# Patient Record
Sex: Female | Born: 1970 | Race: White | Hispanic: No | Smoking: Former smoker
Health system: Southern US, Community
[De-identification: ages and names within clinical notes are randomized; demographics above are authoritative.]

## PROBLEM LIST (undated history)

## (undated) DIAGNOSIS — E785 Hyperlipidemia, unspecified: Secondary | ICD-10-CM

## (undated) DIAGNOSIS — J45909 Unspecified asthma, uncomplicated: Secondary | ICD-10-CM

## (undated) DIAGNOSIS — I10 Essential (primary) hypertension: Secondary | ICD-10-CM

## (undated) DIAGNOSIS — I639 Cerebral infarction, unspecified: Secondary | ICD-10-CM

## (undated) HISTORY — DX: Hyperlipidemia, unspecified: E78.5

## (undated) HISTORY — DX: Cerebral infarction, unspecified: I63.9

## (undated) HISTORY — DX: Unspecified asthma, uncomplicated: J45.909

---

## 1998-06-26 ENCOUNTER — Emergency Department (HOSPITAL_COMMUNITY): Admission: EM | Admit: 1998-06-26 | Discharge: 1998-06-26 | Payer: Self-pay | Admitting: Emergency Medicine

## 2001-04-13 ENCOUNTER — Emergency Department (HOSPITAL_COMMUNITY): Admission: EM | Admit: 2001-04-13 | Discharge: 2001-04-13 | Payer: Self-pay | Admitting: *Deleted

## 2001-04-23 ENCOUNTER — Ambulatory Visit (HOSPITAL_COMMUNITY): Admission: RE | Admit: 2001-04-23 | Discharge: 2001-04-23 | Payer: Self-pay | Admitting: Obstetrics & Gynecology

## 2001-04-23 ENCOUNTER — Encounter: Admission: RE | Admit: 2001-04-23 | Discharge: 2001-04-23 | Payer: Self-pay | Admitting: Internal Medicine

## 2001-05-13 ENCOUNTER — Ambulatory Visit (HOSPITAL_COMMUNITY): Admission: RE | Admit: 2001-05-13 | Discharge: 2001-05-13 | Payer: Self-pay | Admitting: Obstetrics & Gynecology

## 2001-06-18 ENCOUNTER — Encounter: Admission: RE | Admit: 2001-06-18 | Discharge: 2001-06-18 | Payer: Self-pay | Admitting: Obstetrics

## 2001-07-15 ENCOUNTER — Encounter: Admission: RE | Admit: 2001-07-15 | Discharge: 2001-07-15 | Payer: Self-pay | Admitting: Obstetrics & Gynecology

## 2001-07-29 ENCOUNTER — Encounter: Admission: RE | Admit: 2001-07-29 | Discharge: 2001-07-29 | Payer: Self-pay | Admitting: Obstetrics & Gynecology

## 2001-08-12 ENCOUNTER — Encounter: Admission: RE | Admit: 2001-08-12 | Discharge: 2001-08-12 | Payer: Self-pay | Admitting: Obstetrics & Gynecology

## 2001-08-14 ENCOUNTER — Ambulatory Visit (HOSPITAL_COMMUNITY): Admission: RE | Admit: 2001-08-14 | Discharge: 2001-08-14 | Payer: Self-pay | Admitting: Obstetrics

## 2001-08-26 ENCOUNTER — Encounter: Payer: Self-pay | Admitting: Obstetrics

## 2001-08-26 ENCOUNTER — Inpatient Hospital Stay (HOSPITAL_COMMUNITY): Admission: AD | Admit: 2001-08-26 | Discharge: 2001-09-02 | Payer: Self-pay | Admitting: *Deleted

## 2001-08-26 ENCOUNTER — Encounter: Admission: RE | Admit: 2001-08-26 | Discharge: 2001-08-26 | Payer: Self-pay | Admitting: Obstetrics & Gynecology

## 2001-09-10 ENCOUNTER — Inpatient Hospital Stay (HOSPITAL_COMMUNITY): Admission: AD | Admit: 2001-09-10 | Discharge: 2001-09-25 | Payer: Self-pay | Admitting: Obstetrics & Gynecology

## 2001-09-10 ENCOUNTER — Encounter: Admission: RE | Admit: 2001-09-10 | Discharge: 2001-09-10 | Payer: Self-pay | Admitting: Obstetrics

## 2001-09-10 ENCOUNTER — Encounter (INDEPENDENT_AMBULATORY_CARE_PROVIDER_SITE_OTHER): Payer: Self-pay

## 2001-09-12 ENCOUNTER — Encounter: Payer: Self-pay | Admitting: *Deleted

## 2001-09-19 ENCOUNTER — Encounter: Payer: Self-pay | Admitting: *Deleted

## 2001-09-28 ENCOUNTER — Inpatient Hospital Stay (HOSPITAL_COMMUNITY): Admission: AD | Admit: 2001-09-28 | Discharge: 2001-09-28 | Payer: Self-pay | Admitting: Obstetrics

## 2001-10-06 ENCOUNTER — Inpatient Hospital Stay (HOSPITAL_COMMUNITY): Admission: AD | Admit: 2001-10-06 | Discharge: 2001-10-06 | Payer: Self-pay | Admitting: *Deleted

## 2001-10-20 ENCOUNTER — Encounter: Admission: RE | Admit: 2001-10-20 | Discharge: 2001-10-20 | Payer: Self-pay | Admitting: Internal Medicine

## 2007-04-10 ENCOUNTER — Emergency Department (HOSPITAL_COMMUNITY): Admission: EM | Admit: 2007-04-10 | Discharge: 2007-04-10 | Payer: Self-pay | Admitting: Emergency Medicine

## 2011-02-08 NOTE — Discharge Summary (Signed)
[facility]  Patient:    Katie Nichols, Katie Nichols Visit Number: [id number] MRN: [id number]          Service Type: MED Location: [facility] Attending Physician:  Tammi Sou Dictated by:   Marlinda Mike, C.N.M. Admit Date:  09/28/2001 Discharge Date: 09/28/2001                             Discharge Summary  ADMITTING DIAGNOSES:          1. Intrauterine pregnancy at [redacted] weeks                                  gestation.                               2. Evaluation for preeclampsia.  DISCHARGE DIAGNOSES:          1. Intrauterine pregnancy at 34-4/7 weeks.                               2. Pregnancy-induced hypertension.                               3. Mild preeclampsia.                               4. Positive group beta Streptococcus.                               5. Asthma, stable condition.                               6. Insulin-requiring gestational diabetic.  HISTORY OF PRESENT ILLNESS:   The patient is a 40 year old gravida 3, para 0-0-2-0, last menstrual period of January 04, 2001, gives an [facility] of October 13, 2001, which correlates with an ultrasound of October 11, 2001.  The patient is 33-1/[redacted] weeks gestation.  Onset of care at the [facility] with transfer to high-risk clinic due to elevated blood pressures in second trimester.  Patient seen at clinic on todays date with noted elevated blood pressures of 164/91, 161/102, 142/82.  A six-pound weight gain in one week with complaint of edema, 2+ edema noted in ankles and feet.  Reflexes within normal limits.  No headache, no visual changes, no epigastric pain.  The patient was sent for direct admission.  Ultrasound on November 22 noted an LGA infant at greater than 97th percentile with an AFI of 16.  The patient had had a one-hour Glucola with a result of 102.  ALLERGIES:                    No known drug allergies.  MEDICATIONS:                  Prenatal vitamins one q.d.,  Tums over-the-counter p.r.n., Advair, one puff b.i.d., albuterol inhaler p.r.n. acute asthma.  PRENATAL LABORATORY DATA:     O positive.  Antibody screen negative.  RPR negative.  Rubella nonimmune.  Hepatitis negative.  HIV negative.  HOSPITAL COURSE:              The patient was admitted for observation, blood pressure monitoring q.4h., and PIH labs.  Lab work revealed on admission white blood cell count of 6.2, hemoglobin of 10.6, hematocrit or 31.1, and platelet count of 160.  Creatinine of 0.5, AST of 10, ALT of 15, LDH of 114, and a uric acid of 4.5.  Labs repeated on day #4 of hospitalization with a hemoglobin of 10.3, hematocrit of 10.9, platelet count of 136, AST of 11, ALT 13, uric acid of 4.4, and LDH of 97.  Of note, patient has low platelet count.  Will continue to monitor.  Patient underwent 24-hour urine.  Results are total volume of 3400 in 24 hours, serum creatinine of 0.6, urine creatinine of 59.3, total protein of 7. Urinalysis was negative, no protein noted on exam.  Group beta strep culture done on admission was positive.  Ultrasound during admission:  The patient had an ultrasound for biophysical profile and AFI. Biophysical profile received six out of eight, two movement, two for tone, two for fluid, 0 for breathing.  AFI was noted to be 15.5, within normal limits. Cervical length of 4.3 translabial at time of exam.  Blood pressures during hospitalization were labile, 130s-160s/70s-90s.  Fasting blood sugars were found to be elevated, and patient was referred for diabetic management during hospitalization, put on an ADA diet, and started on insulin dose.  Patient was started on NPH 10 units subcu at h.s.  Patient to monitor blood sugars at home four times a day.  Patient discharged on September 02, 2001, in stable condition.  Blood pressures labile.  Insulin-requiring gestational diabetes, stable, to be followed at the high-risk clinic and by diabetic management  psot discharge.  PIH with mild pre-eclampsia, blood pressures labile with elevation noted, no higher than greater than 90 diastolic.  Asthma is stable, continue on current management.  Positive group beta strep diagnosed during hospitalization, to treat prophylactically in labor.  Known LGA baby secondary to gestational diabetes.  Will repeat growth three weeks from previous exam. Irregular preterm contractions, no cervical change, no preterm labor.  Patient discharged on September 02, 2001.  DISCHARGE INSTRUCTIONS:       Routine antenatal discharge instructions, to return with increasing contractions, vaginal bleeding, any fluid leakage, or any decreased fetal movement.  Patient is to do fetal kick counts daily. Patient to continue on insulin dose at home and diabetic diet.  Patient is on bed rest in lateral position.  To follow up at the high-risk clinic next Wednesday, as previously scheduled.  PIH precautions also reviewed with the patient, to return with severe headache, upper abdominal pain, nausea, vomiting, visual changes, or increasing swelling. Dictated by:   Marlinda Mike, C.N.M. Attending Physician:  Tammi Sou DD:  10/04/01 TD:  10/05/01 Job: [id number]

## 2011-02-08 NOTE — Op Note (Signed)
[facility]  Patient:    Katie Nichols, Katie Nichols Visit Number: [id number] MRN: [id number]          Service Type: OBS Location: 910B [id number] [id number] Attending Physician:  Antionette Char Dictated by:   Georgina Peer, M.D. Proc. Date: 09/21/01 Admit Date:  09/10/2001   CC:         OB/GYN Teaching Office                           Operative Report  PREOPERATIVE DIAGNOSES:       Intrauterine pregnancy, 36 weeks, insulin-dependent gestational diabetes, severe preeclampsia with oliguria in active phase arrest.  POSTOPERATIVE DIAGNOSIS:      Intrauterine pregnancy, 36 weeks, insulin-dependent gestational diabetes, severe preeclampsia with oliguria in active phase arrest.  OPERATION PERFORMED:          Primary low cervical transverse cesarean section.  SURGEON:                      Georgina Peer, M.D.  ASSISTANT:                    Lucille Passy, M.D.  ANESTHESIA:                   Epidural, Dr. Pamalee Leyden.  ESTIMATED BLOOD LOSS:         750 cc.  FINDINGS:                     An 8 pound 13 ounce female infant at 2339 hours, LOT, normal tubes and ovaries. Apgars were 9 and 9.  INDICATIONS:                  The patient is a 40 year old, gravida 3, para 0-0-2-0 had been hospitalized since December 19 for managment of insulin-dependent gestational diabetes, hypertension and preeclampsia. Induction was performed. The patient reached 8 cm after having spontaneous rupture of membranes on December 29. The patient reached 8 cm with adequate contractions that did not descend below -3 station. There was capita molding in the cervix after an hour and a half, was 6 cm and swollen. Diagnosis of active phase arrest was made. The patients urine output had decreased from approximately 100 cc/h to 20-30 cc/h. It was felt that this preeclampsia was becoming more severe and the patient was not progressing and a cesarean section was offered and accepted. Risks and  complications including bowel, bladder, vascular injury, bleeding, infection of wound or uterus or skin, blood clots, anesthetic complications was all detailed with the patient and her husband and they accepted the risks. An epidural had been placed during labor and she was taken to the operating room. She was prepped and draped in the normal sterile fashion.  DESCRIPTION OF PROCEDURE:     A transverse incision was made after anesthesia was established with an Allis clamp. This was taken into the peritoneal cavity in a normal Pfannenstiel manner. Bleeders were cauterized. A transverse bladder flap was created and the bladder blade placed over the bladder flat where transverse uterine incision was made and at 2339 hours an incision was made and a female infant delivered with the aid of fundal pressure. The infant cried spontaneously. The cord was doubly clamped and cut. The infants mouth and nose were suctioned. Cord blood was taken and the infant weighed 8 pounds and 13 ounces and was assigned Apgars of  9 and 9 by the pediatric team in attendance. The placenta and membranes removed and sent to pathology. There were small bilateral extensions of the uterine incision which were repaired separately with Vicryl suture. Vicryl suture then closed the incision in one layer with a second layer imbricating for bleeding. There was excellent hemostasis. Tubes and ovaries inspected and found to be normal. A figure-of-eight suture reapproximated the bladder flap. The underside of the fascia and muscles were inspected for bleeding and the fascia was closed with absorbable suture. The subcutaneous tissue was closed with plain gut. Bleeders were cauterized and the skin closed with skin staples. Sponge, needle, and instrument counts were correct. The patient did receive Cefotan post cord clamping.  She will be placed back on the magnesium sulfate postoperative, will be monitored closely for blood pressure  and urine output. Labs will be obtained in the morning. She will be placed back on the Glucommander for glucose control until morning at which diet may be advanced. Dictated by:   Georgina Peer, M.D. Attending Physician:  Antionette Char DD:  09/22/01 TD:  09/22/01 Job: [id number]

## 2011-02-08 NOTE — H&P (Signed)
[facility]  Patient:    Katie Nichols, Katie Nichols Visit Number: [id number] MRN: [id number]          Service Type: MED Location: [facility] Attending Physician:  Tammi Sou Dictated by:   Marlinda Mike, CNM Admit Date:  09/28/2001 Discharge Date: 09/28/2001                           History and Physical  ADMISSION DIAGNOSES:          1. Intrauterine pregnancy at [redacted] weeks gestation.                               2. Evaluation for preeclampsia.  HISTORY OF PRESENT ILLNESS:   The patient is a 40 year old gravida 3, para 0-0-2-0, with an EDC of October 13, 2001.  The patient is followed at the high-risk maternity clinic after referral from [facility].  Onset of prenatal care at [redacted] weeks gestation.  Was transferred to the high-risk clinic at approximately [redacted] weeks gestation for elevated blood pressure.  PRENATAL LABORATORY DATA:     A positive, antibody screen negative.  RPR status negative.  Rubella nonimmune.  Hepatitis negative.  HIV negative.  ALLERGIES:                    No known drug allergies.  CURRENT MEDICATIONS:          1. Prenatal vitamins 1 q.d.                               2. Tums over-the-counter p.r.n.                               3. Advair 1 puff b.i.d.                               4. Albuterol inhaler p.r.n. for acute asthma.  PAST MEDICAL HISTORY:         1. History of elevated blood pressures, with                                  referral to high-risk clinic in second                                  trimester.                               2. History of asthma, with a history of multiple                                  admissions for acute asthma attacks.                               3. Two complete spontaneous ABs, with no D&C  required.  PLAN:                         The patient is admitted to the hospital for evaluation of preeclampsia.  Admitting orders include:  Vital signs  routine. Antenatal unit routine, with blood pressures q.4h. while awake.  Bed rest with bathroom privileges.  Fetal monitoring with a non-stress test initially, and then b.i.d. x 1 hour.  The patient is to have an ultrasound for biophysical profile with an AFI.  Regular diet.  She is to have PIH laboratories, which include a CBC, CMET, uric acid, LDH, urinalysis.  To undergo 24-hour urine collection for total protein and creatinine clearance.  A group beta strep culture was also sent.  ADDENDUM:                     ______  the patient was evaluated at the high-risk clinic with a weight gain of six pounds in a week.  No headache, no visual disturbances, no epigastric pain, complaint of edema.  She has 2+ in ankles and feet.  Blood pressures at clinic are 164/91, 167/102, 142/82. After consult with Dr. Tamela Oddi, the patient was sent for a direct admission related to evaluation of preeclampsia. Dictated by:   Marlinda Mike, CNM Attending Physician:  Tammi Sou DD:  10/04/01 TD:  10/04/01 Job: [id number]

## 2011-02-08 NOTE — Discharge Summary (Signed)
[facility]  Patient:    Katie Nichols, Katie Nichols Visit Number: [id number] MRN: [id number]          Service Type: OBS Location: 910A 9132 01 Attending Physician:  Antionette Char Dictated by:   Ocie Doyne, M.D. Admit Date:  09/10/2001 Discharge Date: 09/25/2001   CC:         High [facility] OB Clinic   Discharge Summary  DISCHARGE DIAGNOSES: 1. Term pregnancy delivered. 2. Low transverse cesarean section. 3. Preeclampsia. 4. Gestational diabetes mellitus. 5. Asthma.  DISCHARGE MEDICATIONS: 1. Percocet 5/325 mg one to two p.o. q.6h. p.r.n. pain. 2. Ibuprofen 600 mg p.o. q.6h. p.r.n. pain. 3. Lopressor 25 mg p.o. b.i.d. #60 given with no refills. 4. Prenatal vitamins one p.o. q.d. x6 weeks.  HISTORY OF PRESENT ILLNESS:  This 40 year old G3, P0-0-2-0, was admitted at 35 weeks for severe pregnancy induced hypertension and mild thrombocytopenia. Her pregnancy was complicated by insulin requiring gestational diabetes mellitus and an infant large for gestational age.  For details, please refer to her admission H&P of 09/10/01.  HOSPITAL COURSE:  #1 - PREGNANCY INDUCED HYPERTENSION WITH THROMBOCYTOPENIA:  Ms. Katie Nichols was admitted to the antenatal unit where she was treated with magnesium sulfate and labetalol as needed for elevated blood pressures.  An OB ultrasound revealed a single fetus in cephalic presentation with a biophysical profile which was reassuring.  Induction of labor was attempted using both Cytotec and Cervadil once the cervix had been ripened.  Low dose Pitocin was started.  She spontaneously ruptured her membranes on September 21, 2001.  An IUPC was placed at that time, and penicillin was started because of her positive Group B strep.  Despite an adequate trial of labor with [place] units between 180 and 220 over several hours, her labor arrested in the active phase, and she underwent a low transverse cesarean section on 09/22/01.   There were no complications.  She delivered a female infant with Apgars of 9 and 9 at one and five minutes, respectively.  She was then admitted to the intensive care unit where she received an additional 24 hours of magnesium sulfate.  This was discontinued on September 23, 2001, and she began to diurese and was transferred out of the intensive care unit.  She did well during the rest of her hospital course, although at discharge she continued to have somewhat elevated blood pressures with a systolic of 170 to 180, and a diastolic of 80 to 100.  She was discharged home on Lopressor 25 mg b.i.d.  A home health nurse will reassess her blood pressure on day #1 and day #3 following hospital discharge. She will follow up at the High Risk Clinic or [facility] in six weeks for her postpartum check, and she will return to [facility] on September 28, 2001, to have her staples removed.  At the time of discharge, her incision is clean, dry, and intact.  #2 - GESTATIONAL DIABETES MELLITUS:  This was insulin requiring throughout the pregnancy.  Insulin was discontinued following delivery, and she will not be discharged home on any medication for this condition.  #3 - ASTHMA:  This was adequately controlled during pregnancy on the Advair discuss and an albuterol metered dose inhaler.  She will continue with these medications.  She is being discharged on a beta 1 selective beta blocker, but is instructed that if she has increasing shortness of breath or wheezing that she is to call her physician immediately for further instructions. Dictated by:  Ocie Doyne, M.D. Attending Physician:  Antionette Char DD:  09/25/01 TD:  09/25/01 Job: [id number]

## 2011-07-08 LAB — BASIC METABOLIC PANEL
BUN: 22
CO2: 26
Calcium: 8.9
Chloride: 104
Creatinine, Ser: 0.53
GFR calc Af Amer: >60
GFR calc non Af Amer: >60
Glucose, Bld: 86
Potassium: 3.8
Sodium: 139

## 2011-07-08 LAB — CBC
MCHC: 34.5
Platelets: 177
RBC: 4.56

## 2011-07-08 LAB — DIFFERENTIAL
Basophils Absolute: 0
Basophils Relative: 1
Monocytes Relative: 5
Neutro Abs: 3.6
Neutrophils Relative %: 65

## 2015-06-19 ENCOUNTER — Encounter (HOSPITAL_COMMUNITY): Payer: Self-pay | Admitting: *Deleted

## 2015-06-19 ENCOUNTER — Inpatient Hospital Stay (HOSPITAL_COMMUNITY)
Admission: EM | Admit: 2015-06-19 | Discharge: 2015-06-29 | DRG: 064 | Disposition: A | Discharge status: Skilled Nursing Facility | Payer: Medicaid Other | Attending: Neurology | Admitting: Neurology

## 2015-06-19 ENCOUNTER — Inpatient Hospital Stay (HOSPITAL_COMMUNITY): Payer: Medicaid Other

## 2015-06-19 ENCOUNTER — Emergency Department (HOSPITAL_COMMUNITY): Payer: Medicaid Other

## 2015-06-19 DIAGNOSIS — I619 Nontraumatic intracerebral hemorrhage, unspecified: Secondary | ICD-10-CM | POA: Diagnosis present

## 2015-06-19 DIAGNOSIS — R471 Dysarthria and anarthria: Secondary | ICD-10-CM | POA: Diagnosis present

## 2015-06-19 DIAGNOSIS — I610 Nontraumatic intracerebral hemorrhage in hemisphere, subcortical: Principal | ICD-10-CM

## 2015-06-19 DIAGNOSIS — E2601 Conn's syndrome: Secondary | ICD-10-CM | POA: Diagnosis present

## 2015-06-19 DIAGNOSIS — R402242 Coma scale, best verbal response, confused conversation, at arrival to emergency department: Secondary | ICD-10-CM | POA: Diagnosis present

## 2015-06-19 DIAGNOSIS — G935 Compression of brain: Secondary | ICD-10-CM | POA: Diagnosis present

## 2015-06-19 DIAGNOSIS — G936 Cerebral edema: Secondary | ICD-10-CM | POA: Diagnosis present

## 2015-06-19 DIAGNOSIS — R739 Hyperglycemia, unspecified: Secondary | ICD-10-CM | POA: Diagnosis present

## 2015-06-19 DIAGNOSIS — J9601 Acute respiratory failure with hypoxia: Secondary | ICD-10-CM

## 2015-06-19 DIAGNOSIS — I160 Hypertensive urgency: Secondary | ICD-10-CM | POA: Diagnosis present

## 2015-06-19 DIAGNOSIS — I701 Atherosclerosis of renal artery: Secondary | ICD-10-CM | POA: Diagnosis present

## 2015-06-19 DIAGNOSIS — Z6834 Body mass index (BMI) 34.0-34.9, adult: Secondary | ICD-10-CM

## 2015-06-19 DIAGNOSIS — R402142 Coma scale, eyes open, spontaneous, at arrival to emergency department: Secondary | ICD-10-CM | POA: Diagnosis present

## 2015-06-19 DIAGNOSIS — Z01818 Encounter for other preprocedural examination: Secondary | ICD-10-CM | POA: Insufficient documentation

## 2015-06-19 DIAGNOSIS — R4781 Slurred speech: Secondary | ICD-10-CM | POA: Diagnosis present

## 2015-06-19 DIAGNOSIS — R402362 Coma scale, best motor response, obeys commands, at arrival to emergency department: Secondary | ICD-10-CM | POA: Diagnosis present

## 2015-06-19 DIAGNOSIS — E669 Obesity, unspecified: Secondary | ICD-10-CM | POA: Diagnosis present

## 2015-06-19 DIAGNOSIS — G9340 Encephalopathy, unspecified: Secondary | ICD-10-CM | POA: Diagnosis not present

## 2015-06-19 DIAGNOSIS — Z9911 Dependence on respirator [ventilator] status: Secondary | ICD-10-CM | POA: Insufficient documentation

## 2015-06-19 DIAGNOSIS — G8191 Hemiplegia, unspecified affecting right dominant side: Secondary | ICD-10-CM

## 2015-06-19 DIAGNOSIS — E876 Hypokalemia: Secondary | ICD-10-CM | POA: Diagnosis not present

## 2015-06-19 DIAGNOSIS — Z9289 Personal history of other medical treatment: Secondary | ICD-10-CM

## 2015-06-19 DIAGNOSIS — R400 Somnolence: Secondary | ICD-10-CM

## 2015-06-19 DIAGNOSIS — Z452 Encounter for adjustment and management of vascular access device: Secondary | ICD-10-CM

## 2015-06-19 DIAGNOSIS — E785 Hyperlipidemia, unspecified: Secondary | ICD-10-CM | POA: Diagnosis present

## 2015-06-19 DIAGNOSIS — I615 Nontraumatic intracerebral hemorrhage, intraventricular: Secondary | ICD-10-CM | POA: Diagnosis not present

## 2015-06-19 DIAGNOSIS — I959 Hypotension, unspecified: Secondary | ICD-10-CM | POA: Diagnosis present

## 2015-06-19 DIAGNOSIS — I10 Essential (primary) hypertension: Secondary | ICD-10-CM | POA: Insufficient documentation

## 2015-06-19 DIAGNOSIS — R4701 Aphasia: Secondary | ICD-10-CM | POA: Diagnosis present

## 2015-06-19 DIAGNOSIS — I161 Hypertensive emergency: Secondary | ICD-10-CM | POA: Insufficient documentation

## 2015-06-19 HISTORY — DX: Essential (primary) hypertension: I10

## 2015-06-19 LAB — BLOOD GAS, ARTERIAL
ACID-BASE DEFICIT: 5.8 mmol/L — AB (ref 0.0–2.0)
BICARBONATE: 20.1 meq/L (ref 20.0–24.0)
Drawn by: 437521
FIO2: 1
LHR: 16 {breaths}/min
O2 SAT: 99.5 %
PATIENT TEMPERATURE: 98.6
PCO2 ART: 47.1 mmHg — AB (ref 35.0–45.0)
PEEP: 5 cmH2O
PH ART: 7.253 — AB (ref 7.350–7.450)
TCO2: 21.6 mmol/L (ref 0–100)
VT: 480 mL
pO2, Arterial: 564 mmHg — ABNORMAL HIGH (ref 80.0–100.0)

## 2015-06-19 LAB — URINALYSIS, ROUTINE W REFLEX MICROSCOPIC
Bilirubin Urine: NEGATIVE
GLUCOSE, UA: 250 mg/dL — AB
Ketones, ur: 15 mg/dL — AB
LEUKOCYTES UA: NEGATIVE
Nitrite: NEGATIVE
PH: 7 (ref 5.0–8.0)
Protein, ur: >300 mg/dL — AB
SPECIFIC GRAVITY, URINE: 1.013 (ref 1.005–1.030)
Urobilinogen, UA: 0.2 mg/dL (ref 0.0–1.0)

## 2015-06-19 LAB — DIFFERENTIAL
BASOS PCT: 1 %
Basophils Absolute: 0.1 10*3/uL (ref 0.0–0.1)
EOS PCT: 4 %
Eosinophils Absolute: 0.3 10*3/uL (ref 0.0–0.7)
Lymphocytes Relative: 27 %
Lymphs Abs: 1.9 10*3/uL (ref 0.7–4.0)
MONO ABS: 0.3 10*3/uL (ref 0.1–1.0)
Monocytes Relative: 4 %
NEUTROS ABS: 4.3 10*3/uL (ref 1.7–7.7)
Neutrophils Relative %: 64 %

## 2015-06-19 LAB — CBC
HCT: 43.1 % (ref 36.0–46.0)
Hemoglobin: 14.7 g/dL (ref 12.0–15.0)
MCH: 29.4 pg (ref 26.0–34.0)
MCHC: 34.1 g/dL (ref 30.0–36.0)
MCV: 86.2 fL (ref 78.0–100.0)
PLATELETS: 199 10*3/uL (ref 150–400)
RBC: 5 MIL/uL (ref 3.87–5.11)
RDW: 13.1 % (ref 11.5–15.5)
WBC: 6.8 10*3/uL (ref 4.0–10.5)

## 2015-06-19 LAB — PROTIME-INR
INR: 1.01 (ref 0.00–1.49)
PROTHROMBIN TIME: 13.5 s (ref 11.6–15.2)

## 2015-06-19 LAB — URINE MICROSCOPIC-ADD ON

## 2015-06-19 LAB — COMPREHENSIVE METABOLIC PANEL
ALT: 13 U/L — AB (ref 14–54)
ANION GAP: 11 (ref 5–15)
AST: 19 U/L (ref 15–41)
Albumin: 4.2 g/dL (ref 3.5–5.0)
Alkaline Phosphatase: 56 U/L (ref 38–126)
BUN: 17 mg/dL (ref 6–20)
CHLORIDE: 106 mmol/L (ref 101–111)
CO2: 19 mmol/L — AB (ref 22–32)
Calcium: 9.5 mg/dL (ref 8.9–10.3)
Creatinine, Ser: 0.66 mg/dL (ref 0.44–1.00)
GFR calc non Af Amer: >60 mL/min (ref >60)
Glucose, Bld: 105 mg/dL — ABNORMAL HIGH (ref 65–99)
Potassium: 3.8 mmol/L (ref 3.5–5.1)
SODIUM: 136 mmol/L (ref 135–145)
Total Bilirubin: 0.6 mg/dL (ref 0.3–1.2)
Total Protein: 7.3 g/dL (ref 6.5–8.1)

## 2015-06-19 LAB — I-STAT CHEM 8, ED
BUN: 22 mg/dL — ABNORMAL HIGH (ref 6–20)
Calcium, Ion: 1.02 mmol/L — ABNORMAL LOW (ref 1.12–1.23)
Chloride: 104 mmol/L (ref 101–111)
Creatinine, Ser: 0.6 mg/dL (ref 0.44–1.00)
GLUCOSE: 107 mg/dL — AB (ref 65–99)
HEMATOCRIT: 47 % — AB (ref 36.0–46.0)
HEMOGLOBIN: 16 g/dL — AB (ref 12.0–15.0)
POTASSIUM: 3.7 mmol/L (ref 3.5–5.1)
SODIUM: 138 mmol/L (ref 135–145)
TCO2: 19 mmol/L (ref 0–100)

## 2015-06-19 LAB — CBG MONITORING, ED: GLUCOSE-CAPILLARY: 200 mg/dL — AB (ref 65–99)

## 2015-06-19 LAB — APTT: aPTT: 31 seconds (ref 24–37)

## 2015-06-19 LAB — TRIGLYCERIDES: Triglycerides: 183 mg/dL — ABNORMAL HIGH (ref <150)

## 2015-06-19 LAB — I-STAT TROPONIN, ED: Troponin i, poc: 0 ng/mL (ref 0.00–0.08)

## 2015-06-19 MED ORDER — PROPOFOL 1000 MG/100ML IV EMUL
5.0000 ug/kg/min | INTRAVENOUS | Status: DC
Start: 2015-06-19 — End: 2015-06-19
  Administered 2015-06-19: 50 ug/kg/min via INTRAVENOUS

## 2015-06-19 MED ORDER — ETOMIDATE 2 MG/ML IV SOLN
INTRAVENOUS | Status: AC
  Administered 2015-06-19: 30 mg via INTRAVENOUS
  Filled 2015-06-19: qty 20

## 2015-06-19 MED ORDER — SODIUM CHLORIDE 0.9 % IV SOLN
25.0000 ug/h | INTRAVENOUS | Status: DC
  Administered 2015-06-19: 150 ug/h via INTRAVENOUS
  Administered 2015-06-20 – 2015-06-21 (×3): 300 ug/h via INTRAVENOUS
  Filled 2015-06-19 (×3): qty 50

## 2015-06-19 MED ORDER — LABETALOL HCL 5 MG/ML IV SOLN
10.0000 mg | INTRAVENOUS | Status: DC | PRN
  Administered 2015-06-21: 20 mg via INTRAVENOUS
  Administered 2015-06-21: 10 mg via INTRAVENOUS
  Administered 2015-06-21: 20 mg via INTRAVENOUS
  Administered 2015-06-21: 10 mg via INTRAVENOUS
  Administered 2015-06-21: 40 mg via INTRAVENOUS
  Administered 2015-06-21: 20 mg via INTRAVENOUS
  Administered 2015-06-22 (×4): 40 mg via INTRAVENOUS
  Administered 2015-06-25 (×3): 20 mg via INTRAVENOUS
  Administered 2015-06-25 – 2015-06-26 (×2): 40 mg via INTRAVENOUS
  Administered 2015-06-26: 20 mg via INTRAVENOUS
  Administered 2015-06-26 (×2): 40 mg via INTRAVENOUS
  Filled 2015-06-19 (×2): qty 8
  Filled 2015-06-19: qty 4
  Filled 2015-06-19 (×4): qty 8
  Filled 2015-06-19: qty 4
  Filled 2015-06-19: qty 8
  Filled 2015-06-19: qty 4
  Filled 2015-06-19: qty 8
  Filled 2015-06-19 (×2): qty 4
  Filled 2015-06-19: qty 8
  Filled 2015-06-19: qty 4

## 2015-06-19 MED ORDER — ONDANSETRON HCL 4 MG/2ML IJ SOLN
INTRAMUSCULAR | Status: AC
  Administered 2015-06-19: 4 mg
  Filled 2015-06-19: qty 2

## 2015-06-19 MED ORDER — ALBUTEROL SULFATE (2.5 MG/3ML) 0.083% IN NEBU
2.5000 mg | INHALATION_SOLUTION | RESPIRATORY_TRACT | Status: DC | PRN
  Administered 2015-06-21 – 2015-06-29 (×11): 2.5 mg via RESPIRATORY_TRACT
  Filled 2015-06-19 (×13): qty 3

## 2015-06-19 MED ORDER — FENTANYL CITRATE (PF) 100 MCG/2ML IJ SOLN: 100.0000 ug | INTRAMUSCULAR | Status: DC | PRN

## 2015-06-19 MED ORDER — LIDOCAINE HCL (CARDIAC) 20 MG/ML IV SOLN
INTRAVENOUS | Status: AC
  Filled 2015-06-19: qty 5

## 2015-06-19 MED ORDER — PROPOFOL 1000 MG/100ML IV EMUL
INTRAVENOUS | Status: AC
  Administered 2015-06-19: 50 ug/kg/min via INTRAVENOUS
  Filled 2015-06-19: qty 100

## 2015-06-19 MED ORDER — SENNOSIDES-DOCUSATE SODIUM 8.6-50 MG PO TABS
1.0000 | ORAL_TABLET | Freq: Two times a day (BID) | ORAL | Status: DC
  Administered 2015-06-20 – 2015-06-29 (×15): 1 via ORAL
  Filled 2015-06-19 (×16): qty 1

## 2015-06-19 MED ORDER — ACETAMINOPHEN 650 MG RE SUPP: 650.0000 mg | RECTAL | Status: DC | PRN

## 2015-06-19 MED ORDER — CHLORHEXIDINE GLUCONATE 0.12% ORAL RINSE (MEDLINE KIT)
15.0000 mL | Freq: Two times a day (BID) | OROMUCOSAL | Status: DC
  Administered 2015-06-20 – 2015-06-24 (×11): 15 mL via OROMUCOSAL

## 2015-06-19 MED ORDER — LABETALOL HCL 5 MG/ML IV SOLN
20.0000 mg | Freq: Once | INTRAVENOUS | Status: AC
  Administered 2015-06-19: 20 mg via INTRAVENOUS
  Filled 2015-06-19: qty 4

## 2015-06-19 MED ORDER — ACETAMINOPHEN 325 MG PO TABS
650.0000 mg | ORAL_TABLET | ORAL | Status: DC | PRN
  Administered 2015-06-24 – 2015-06-26 (×2): 650 mg via ORAL
  Filled 2015-06-19 (×2): qty 2

## 2015-06-19 MED ORDER — SODIUM CHLORIDE 0.9 % IV SOLN
10.0000 ug/h | INTRAVENOUS | Status: DC
  Administered 2015-06-19: 25 ug/h via INTRAVENOUS
  Filled 2015-06-19: qty 50

## 2015-06-19 MED ORDER — PANTOPRAZOLE SODIUM 40 MG IV SOLR
40.0000 mg | Freq: Every day | INTRAVENOUS | Status: DC
  Administered 2015-06-19 – 2015-06-22 (×4): 40 mg via INTRAVENOUS
  Filled 2015-06-19 (×4): qty 40

## 2015-06-19 MED ORDER — ANTISEPTIC ORAL RINSE SOLUTION (CORINZ)
7.0000 mL | Freq: Four times a day (QID) | OROMUCOSAL | Status: DC
  Administered 2015-06-20 – 2015-06-25 (×21): 7 mL via OROMUCOSAL

## 2015-06-19 MED ORDER — SODIUM CHLORIDE 0.9 % IV SOLN
1.0000 g | Freq: Once | INTRAVENOUS | Status: AC
  Administered 2015-06-19: 1 g via INTRAVENOUS
  Filled 2015-06-19: qty 10

## 2015-06-19 MED ORDER — NICARDIPINE HCL IN NACL 20-0.86 MG/200ML-% IV SOLN
3.0000 mg/h | INTRAVENOUS | Status: DC
  Administered 2015-06-19: 6.5 mg/h via INTRAVENOUS
  Administered 2015-06-21: 5 mg/h via INTRAVENOUS
  Administered 2015-06-21: 7.5 mg/h via INTRAVENOUS
  Filled 2015-06-19 (×4): qty 200

## 2015-06-19 MED ORDER — ROCURONIUM BROMIDE 50 MG/5ML IV SOLN
INTRAVENOUS | Status: AC
  Filled 2015-06-19: qty 2

## 2015-06-19 MED ORDER — NICARDIPINE HCL IN NACL 20-0.86 MG/200ML-% IV SOLN
3.0000 mg/h | Freq: Once | INTRAVENOUS | Status: AC
  Administered 2015-06-19: 5 mg/h via INTRAVENOUS
  Filled 2015-06-19: qty 200

## 2015-06-19 MED ORDER — PROPOFOL 1000 MG/100ML IV EMUL
0.0000 ug/kg/min | INTRAVENOUS | Status: DC
  Administered 2015-06-19: 50 ug/kg/min via INTRAVENOUS

## 2015-06-19 MED ORDER — SODIUM CHLORIDE 0.9 % IV SOLN: INTRAVENOUS | Status: DC | PRN

## 2015-06-19 MED ORDER — SUCCINYLCHOLINE CHLORIDE 20 MG/ML IJ SOLN
INTRAMUSCULAR | Status: AC
  Administered 2015-06-19: 150 mg via INTRAVENOUS
  Filled 2015-06-19: qty 1

## 2015-06-19 MED ORDER — STROKE: EARLY STAGES OF RECOVERY BOOK
Freq: Once | Status: AC
  Administered 2015-06-19: 21:00:00
  Filled 2015-06-19: qty 1

## 2015-06-19 NOTE — H&P (Addendum)
Stroke Consult    Chief Complaint: right sided weakness  HPI: Katie Nichols is an 44 y.o. female hx of HTN, asthma presenting as code stroke with acute onset of right sided weakness and sensory deficits. LSW 1640 when she initially noted shortness of breath followed by weakness and numbness on her right side. Code stroke activated. On EMS arrival noted to have BP of 280/180 with HR in the 120s. Patient reports history of HTN but has not been to a doctor in years, on no antihypertensives. Not on any antiplatelet or anticoagulant.   CT head imaging reviewed shows left basal ganglia ICH with minimal intra-ventricular extension with around 3mm midline shift. No signs of early hydrocephalus. Initial NIHSS of 12  Date last known well: 9/26 Time last known well: 1630 tPA Given: No, ICH Modified Rankin: Rankin Score=0  ICH Score: 1       No past medical history on file.  No past surgical history on file.  No family history on file. Social History:  has no tobacco, alcohol, and drug history on file.  Allergies: Allergies not on file   (Not in a hospital admission)  ROS: Out of a complete 14 system review, the patient complains of only the following symptoms, and all other reviewed systems are negative. + weakness  Physical Examination: There were no vitals filed for this visit. Physical Exam  Constitutional: He appears well-developed and well-nourished.  Psych: Affect appropriate to situation Eyes: No scleral injection HENT: No OP obstrucion Head: Normocephalic.  Cardiovascular: Normal rate and regular rhythm.  Respiratory: coarse bilateral breath sounds GI: Soft. Bowel sounds are normal. No distension. There is no tenderness.  Skin: WDI  Neurologic Examination: Mental Status: Alert, oriented to name only, intermittently will answer date correctly.  Speech fluent without evidence of aphasia. Mild to moderate dysarthria.  Able to follow simple commands without  difficulty Cranial Nerves: II: funduscopic exam wnl bilaterally, visual fields grossly normal, pupils equal, round, reactive to light and accommodation III,IV, VI: ptosis not present, extra-ocular motions intact bilaterally V,VII: right facial weakness, facial light touch sensation normal bilaterally VIII: hearing normal bilaterally IX,X: gag reflex present XI: trapezius strength/neck flexion strength normal bilaterally XII: tongue strength normal  Motor: LUE and LLE 5/5 strength Flaccid RUE Flaccid RLE Tone and bulk:normal tone throughout; no atrophy noted Sensory: no withdrawal to noxious stimuli on RUE, weak withdrawal on RLE Deep Tendon Reflexes: 2+ and symmetric throughout Plantars: Right: downgoing   Left: downgoing Cerebellar: Unable to test on right side, intact FTN and HTS on left Gait: deferred  Laboratory Studies:   Basic Metabolic Panel:  Recent Labs Lab 06/19/15 1922  NA 138  K 3.7  CL 104  GLUCOSE 107*  BUN 22*  CREATININE 0.60    Liver Function Tests: No results for input(s): AST, ALT, ALKPHOS, BILITOT, PROT, ALBUMIN in the last 168 hours. No results for input(s): LIPASE, AMYLASE in the last 168 hours. No results for input(s): AMMONIA in the last 168 hours.  CBC:  Recent Labs Lab 06/19/15 1922  HGB 16.0*  HCT 47.0*    Cardiac Enzymes: No results for input(s): CKTOTAL, CKMB, CKMBINDEX, TROPONINI in the last 168 hours.  BNP: Invalid input(s): POCBNP  CBG: No results for input(s): GLUCAP in the last 168 hours.  Microbiology: No results found for this or any previous visit.  Coagulation Studies: No results for input(s): LABPROT, INR in the last 72 hours.  Urinalysis: No results for input(s): COLORURINE, LABSPEC, PHURINE, GLUCOSEU, HGBUR, BILIRUBINUR,  KETONESUR, PROTEINUR, UROBILINOGEN, NITRITE, LEUKOCYTESUR in the last 168 hours.  Invalid input(s): APPERANCEUR  Lipid Panel:  No results found for: CHOL, TRIG, HDL, CHOLHDL, VLDL,  LDLCALC  HgbA1C: No results found for: HGBA1C  Urine Drug Screen:  No results found for: LABOPIA, COCAINSCRNUR, LABBENZ, AMPHETMU, THCU, LABBARB  Alcohol Level: No results for input(s): ETH in the last 168 hours.  Other results:  Imaging: No results found.  Assessment: 44 y.o. female hx of HTN admitted as code stroke with acute onset of right sided weakness and sensory deficits. BP upon EMS arrival was 280/180. CT head imaging shows left basal ganglia ICH. Likely hypertensive etiology. Will admit to neuro-ICU, monitor closely. Will plan for repeat CT head in the morning. Stat CT and possible neurosurgical consult if worsening in neurological exam.    Plan: 1) Admit to ICU 2)repeat CT head in the mornign 3) no antiplatelets or anticoagulants 4) blood pressure control with goal systolic <160 5) Frequent neuro checks 6) If symptoms worsen or there is decreased mental status, repeat stat head CT and possible neurosurgical consult 7) PT,OT,ST  Addendum: Patient developed worsening respiratory distress related to likely asthma exacerbation. Decision made to intubate in ED for airway protection. ED to notify CCM.    This patient is critically ill and at significant risk of neurological worsening, death and care requires constant monitoring of vital signs, hemodynamics,respiratory and cardiac monitoring,review of multiple databases, neurological assessment, discussion with family, other specialists and medical decision making of high complexity. I spent 75 inutes of neurocritical care time in the care of this patient.    Elspeth Cho, DO [facility] [phone]  If 7pm- 7am, please page neurology on call as listed in AMION. 06/19/2015, 7:25 PM

## 2015-06-19 NOTE — ED Notes (Signed)
CODE STROKE ACTIVATED -per Aurther Loft ([facility]).

## 2015-06-19 NOTE — ED Provider Notes (Signed)
CSN: [id number]     Arrival date & time 06/19/15  1912 History   First MD Initiated Contact with Patient 06/19/15 1920     Chief Complaint  Patient presents with  . Code Stroke    (Consider location/radiation/quality/duration/timing/severity/associated sxs/prior Treatment) Patient is a 44 y.o. female presenting with Acute Neurological Problem and neurologic complaint. The history is provided by the EMS personnel and the patient. No language interpreter was used.  Cerebrovascular Accident This is a new (Patient not able to appropriately answer all questions with her a fascia and slurred speech.) problem. The current episode started today. The problem occurs constantly. The problem has been gradually worsening. Associated symptoms include numbness and weakness. Pertinent negatives include no chills, congestion, coughing or fever. Associated symptoms comments: Right-sided numbness and weaknes., Slurred speech, confusion.. Nothing aggravates the symptoms. She has tried nothing for the symptoms. The treatment provided no relief.  Neurologic Problem This is a new problem. The current episode started today. The problem occurs constantly. Associated symptoms include numbness and weakness. Pertinent negatives include no chills, congestion, coughing or fever.    Past Medical History  Diagnosis Date  . Hypertension    No past surgical history on file. No family history on file. Social History  Substance Use Topics  . Smoking status: None  . Smokeless tobacco: None  . Alcohol Use: None   OB History    No data available     Review of Systems  Unable to perform ROS Constitutional: Negative for fever and chills.  HENT: Negative for congestion.   Respiratory: Negative for cough.   Neurological: Positive for weakness and numbness.      Allergies  Review of patient's allergies indicates no known allergies.  Home Medications   Prior to Admission medications   Not on File   BP 94/68  mmHg  Pulse 61  Temp(Src) 97.7 F (36.5 C) (Axillary)  Resp 15  Ht  (1.676 m)  Wt 200 lb 6.4 oz (90.9 kg)  BMI 32.36 kg/m2  SpO2 98%  LMP  Physical Exam  Constitutional: She appears well-developed and well-nourished. She appears distressed.  HENT:  Head: Normocephalic and atraumatic.  Mouth/Throat: No oropharyngeal exudate.  Eyes: Conjunctivae are normal. Pupils are equal, round, and reactive to light.  Neck: Normal range of motion.  Cardiovascular: Regular rhythm, normal heart sounds and intact distal pulses.   No murmur heard. Pulmonary/Chest: Effort normal and breath sounds normal. No stridor. She has no wheezes. She exhibits no tenderness.  Abdominal: Soft. There is no rebound and no guarding.  Musculoskeletal: She exhibits no tenderness.  Neurological: She is alert. A sensory deficit is present. No cranial nerve deficit. She exhibits abnormal muscle tone. GCS eye subscore is 4. GCS verbal subscore is 4. GCS motor subscore is 6.  Skin: She is diaphoretic. No pallor.  Nursing note and vitals reviewed.   ED Course  ARTERIAL LINE Date/Time: 06/20/2015 1:23 AM Performed by: Theda Belfast Authorized by: Crista Curb DUO Consent: The procedure was performed in an emergent situation. Required items: required blood products, implants, devices, and special equipment available Patient identity confirmed: verbally with patient and arm band Time out: Immediately prior to procedure a "time out" was called to verify the correct patient, procedure, equipment, support staff and site/side marked as required. Preparation: Patient was prepped and draped in the usual sterile fashion. Indications: hemodynamic monitoring Location: right radial Allen's test normal: yes (Ultrasound guidance utilized) Needle gauge: 20 Seldinger technique: Seldinger technique used Number of attempts:  1 Post-procedure: dressing applied Post-procedure CMS: normal Patient tolerance: Patient tolerated the  procedure well with no immediate complications  INTUBATION Date/Time: 06/20/2015 1:24 AM Performed by: Theda Belfast Authorized by: Crista Curb DUO Consent: The procedure was performed in an emergent situation. Required items: required blood products, implants, devices, and special equipment available Patient identity confirmed: verbally with patient and arm band Time out: Immediately prior to procedure a "time out" was called to verify the correct patient, procedure, equipment, support staff and site/side marked as required. Indications: respiratory distress and  airway protection Intubation method: video-assisted Patient status: paralyzed (RSI) Preoxygenation: nonrebreather mask and BVM Sedatives: etomidate Paralytic: succinylcholine Laryngoscope size: Mac 3 Tube size: 7.5 mm Tube type: cuffed Number of attempts: 1 Cricoid pressure: no Cords visualized: yes Post-procedure assessment: chest rise,  ETCO2 monitor and CO2 detector Breath sounds: equal Cuff inflated: yes ETT to lip: 24 cm Tube secured with: ETT holder Chest x-ray interpreted by me, other physician and radiologist. Chest x-ray findings: endotracheal tube in appropriate position Patient tolerance: Patient tolerated the procedure well with no immediate complications   (including critical care time) Labs Review Labs Reviewed  COMPREHENSIVE METABOLIC PANEL - Abnormal; Notable for the following:    CO2 19 (*)    Glucose, Bld 105 (*)    ALT 13 (*)    All other components within normal limits  BLOOD GAS, ARTERIAL - Abnormal; Notable for the following:    pH, Arterial 7.253 (*)    pCO2 arterial 47.1 (*)    pO2, Arterial 564 (*)    Acid-base deficit 5.8 (*)    All other components within normal limits  URINALYSIS, ROUTINE W REFLEX MICROSCOPIC (NOT AT [facility]) - Abnormal; Notable for the following:    Glucose, UA 250 (*)    Hgb urine dipstick SMALL (*)    Ketones, ur 15 (*)    Protein, ur >300 (*)    All other  components within normal limits  TRIGLYCERIDES - Abnormal; Notable for the following:    Triglycerides 183 (*)    All other components within normal limits  URINE MICROSCOPIC-ADD ON - Abnormal; Notable for the following:    Casts HYALINE CASTS (*)    All other components within normal limits  I-STAT CHEM 8, ED - Abnormal; Notable for the following:    BUN 22 (*)    Glucose, Bld 107 (*)    Calcium, Ion 1.02 (*)    Hemoglobin 16.0 (*)    HCT 47.0 (*)    All other components within normal limits  CBG MONITORING, ED - Abnormal; Notable for the following:    Glucose-Capillary 200 (*)    All other components within normal limits  MRSA PCR SCREENING  PROTIME-INR  APTT  CBC  DIFFERENTIAL  BASIC METABOLIC PANEL  CBC  MAGNESIUM  PHOSPHORUS  BLOOD GAS, ARTERIAL  HEMOGLOBIN A1C  URINE RAPID DRUG SCREEN, HOSP PERFORMED  I-STAT TROPOININ, ED  I-STAT CHEM 8, ED  POC URINE PREG, ED    Imaging Review Ct Head Wo Contrast  06/19/2015   CLINICAL DATA:  RIGHT-sided weakness.  Code stroke.  EXAM: CT HEAD WITHOUT CONTRAST  TECHNIQUE: Contiguous axial images were obtained from the base of the skull through the vertex without intravenous contrast.  COMPARISON:  None.  FINDINGS: Acute hemorrhage is present in the LEFT basal ganglia extending into the LEFT lateral ventricle. This also extends through the foramen of Monro into the anterior RIGHT lateral ventricle and third ventricle. Mass effect is mild. The  hemorrhage measures 27 mm x 24 mm (image 14 series 201).  Tiny amount of blood tracks in of the fourth ventricle. Posterior fossa structures appear normal. 3 mm of LEFT-to-RIGHT midline shift is present.  Calvarium intact. Mastoid air cells clear. Intracranial atherosclerosis.  IMPRESSION: Acute LEFT basal ganglia hemorrhage with intraventricular breakthrough. No early hydrocephalus. 3 mm of LEFT-to- RIGHT midline shift.  Critical Value/emergent results were called by telephone at the time of  interpretation on 06/19/2015 at 7:38 pm to Dr. Hosie Poisson, who verbally acknowledged these results.   Electronically Signed   By: Andreas Newport M.D.   On: 06/19/2015 19:40   Dg Chest Port 1 View  06/19/2015   CLINICAL DATA:  Code stroke.  Intubated patient.  EXAM: PORTABLE CHEST 1 VIEW  COMPARISON:  None.  FINDINGS: Support apparatus: Endotracheal tube tip 26 mm from the carina. Enteric tube present with the tip not visible. Defibrillator pads overlie the chest. Monitoring leads project over the chest.  Cardiomediastinal Silhouette:  Within normal limits.  Lungs: No focal consolidation.  No pneumothorax.  Effusions:  None.  Other:  None.  IMPRESSION: 1. Endotracheal tube tip 26 mm from the carina, in good position. 2. Enteric tube tip not visible. 3. No focal consolidation. Suggestion of pulmonary vascular congestion.   Electronically Signed   By: Andreas Newport M.D.   On: 06/19/2015 21:59   I have personally reviewed and evaluated these images and lab results as part of my medical decision-making.   EKG Interpretation None      MDM   Katie Nichols is a 44 y.o. female with a past medical history significant for untreated hypertension who presents as a code stroke. The patient was last seen normal approximately one hour prior to arrival to the emergency department. The EMS team was called by family when the patient was "acting differently" and could not feel or move her right arm or right leg. The patient was quickly brought to the emergency department. On arrival, the patient was found to have significant weakness and numbness in her right side. The patient also had some slurring of speech but was able to answer questions appropriately.  The patient's airway was deemed intact and the patient was stable for the CT scans. The neurology team was at the bedside as the patient arrived. The patient was found to have a large intracranial hemorrhage likely secondary to significant hypertension. The patient's  blood pressures were above 200 systolic on arrival. The patient was started on a nicardipine drip for blood pressure management. The patient was given 1 dose of IV labetalol prior to nicardipine initiation. The patient had improvement in her blood pressures.  Given the hemodynamic monitoring required, an arterial line was placed by this examiner in the right radial artery. Details of that procedure are seen above. Ultrasound was used for guidance. Next  Following a line placement, the patient began to have worsening mental status and her respiratory rate continued to be elevated. The patient appeared to be fatiguing and the decision was made to intubate the patient for airway protection and impending respiratory failure. The patient was intubated successfully with video laryngoscopy as described above.  Following intubation, the patient was admitted to the critical care service under neurology. The patient did not have any other problems or consultations already in the emergency department. Next  This patient was seen with Dr. Verdie Mosher, emergency medicine attending.   Final diagnoses:  ICH (intracerebral hemorrhage)  Encounter for intubation  Nontraumatic subcortical hemorrhage  of cerebral hemisphere        Theda Belfast, MD 06/20/15 [id number]  Lavera Guise, MD 06/20/15 [phone]

## 2015-06-19 NOTE — ED Notes (Signed)
While patient was in Trauma C her speech became more garbled and her breathing became more labored.  [place] applied but sats remained in the mid 80's on 6l, NRB applied at 10L   Patients breathing became more labored, became blue around the mouth and breathing became "guppy like" breathing.  MD aware and decision was made to intubate the patient.

## 2015-06-19 NOTE — ED Notes (Signed)
Patient presents via EMS.  Patient was found on the floor (original call was for breathing problems) she was not moving her right side and speech was garbled  Upon arrival patient was answering questions but was very anxious

## 2015-06-19 NOTE — Code Documentation (Signed)
Code stroke called at 1859  for this 44 y/o white female pt who was LKW at 1640 hrs.   EMS was originally called to scene for pt with SOB but upon arrival at 1800 hrs pt was found on floor by EMS and noted to have severe right UE and LUE weakness and sensory deficits.    BP per EMS was 280/180 with HR 120s.  Upon arrival to [facility]  at 1912 hrs pt was alert, oriented, following commands, unable to move RUE and RLW but would withdraw to pain.Cleared for CT at 1913 by Dr Rush Landmark, with arrival in CT at 1914 hrs.  Upon completion of CT pt was moved to ED trauma C where her NIHSS scored 11 with points given for partial facial palsy, RUE and RLE motor, partial sensory loss, mild dysarthria and partial neglect.  CT results of a Left Basal Ganglia hemorrhage with IVC breakthrough and a 3 mm left to right midline shift were called to Dr Hosie Poisson at [facility] hrs.  With ED BP 251/165 pt was given labetolol and started on a cardene drip.  CBG 200.  Pt was subsequently intubated for airway protection, and transported to 3 MW02 at 2045 hrs.

## 2015-06-19 NOTE — Consult Note (Signed)
PULMONARY / CRITICAL CARE MEDICINE   Name: Katie Nichols MRN: [id number] DOB: Nov 29, 1970    ADMISSION DATE:  06/19/2015 CONSULTATION DATE:  06/19/15  REFERRING MD :  Roda Shutters  CHIEF COMPLAINT:  weakness  INITIAL PRESENTATION:  44 y.o. F brought to [facility] ED 9/26 as code stroke.  She was found to have left basal ganglia ICH and was intubated for airway protection.  PCCM called for vent management and BP control.    STUDIES:  CT head 9/26 >>> acute left basal ganglia hemorrhage with intraventricular breakthrough.  No early hydrocephalus.  3mm L to R MLS.  SIGNIFICANT EVENTS: 9/26 - admit with left ICH.   HISTORY OF PRESENT ILLNESS:  Pt is encephalopathic; therefore, this HPI is obtained from chart review. Katie Nichols is a 44 y.o. F with PMH of HTN (not on medications) who was brought to [facility] ED 9/26 as code stroke.  She had acute onset of right sided weakness and sensory deficits.  She was last seen normal earlier that evening (around 1640); however, she had SOB at that time for which EMS was dispatched.  On EMS arrival, pt was found on the floor with right sided weakness.  SBP on scene was 280/180.  In ED, CT of the head shows left basal ganglia ICH with minimal intraventricular extension with around 3mm midline shift.  No signs of hydrocephalus.  She was intubated for airway protection and PCCM was consulted for vent management.   PAST MEDICAL HISTORY :   has a past medical history of Hypertension.  has no past surgical history on file. Prior to Admission medications   Not on File   Allergies not on file  FAMILY HISTORY:  No family history on file.  SOCIAL HISTORY:  has no tobacco, alcohol, and drug history on file.  REVIEW OF SYSTEMS:  Unable to obtain as pt is encephalopathic.  SUBJECTIVE:   VITAL SIGNS: Temp:  [97.3 F (36.3 C)] 97.3 F (36.3 C) (09/26 2000) Pulse Rate:  [103] 103 (09/26 2000) Resp:  [30] 30 (09/26 2000) BP: (251)/(165) 251/165 mmHg (09/26 2000) SpO2:  [90  %] 90 % (09/26 2000) Weight:  [90.9 kg (200 lb 6.4 oz)-117.935 kg (260 lb)] 90.9 kg (200 lb 6.4 oz) (09/26 2053) HEMODYNAMICS:   VENTILATOR SETTINGS:   INTAKE / OUTPUT: Intake/Output    None     PHYSICAL EXAMINATION: General: Adult female, in NAD. Neuro: Sedated, does not follow commands. HEENT: [place]/AT. PERRL, sclerae anicteric. Cardiovascular: RRR, no M/R/G.  Lungs: Respirations even and unlabored.  Faint expiratory wheeze. Abdomen: BS x 4, soft, NT/ND.  Musculoskeletal: No gross deformities, no edema.  Skin: Intact, warm, no rashes.  LABS:  CBC  Recent Labs Lab 06/19/15 1918 06/19/15 1922  WBC 6.8  --   HGB 14.7 16.0*  HCT 43.1 47.0*  PLT 199  --    Coag's  Recent Labs Lab 06/19/15 1918  APTT 31  INR 1.01   BMET  Recent Labs Lab 06/19/15 1918 06/19/15 1922  NA 136 138  K 3.8 3.7  CL 106 104  CO2 19*  --   BUN 17 22*  CREATININE 0.66 0.60  GLUCOSE 105* 107*   Electrolytes  Recent Labs Lab 06/19/15 1918  CALCIUM 9.5   Sepsis Markers No results for input(s): LATICACIDVEN, PROCALCITON, O2SATVEN in the last 168 hours. ABG No results for input(s): PHART, PCO2ART, PO2ART in the last 168 hours. Liver Enzymes  Recent Labs Lab 06/19/15 1918  AST 19  ALT  13*  ALKPHOS 56  BILITOT 0.6  ALBUMIN 4.2   Cardiac Enzymes No results for input(s): TROPONINI, PROBNP in the last 168 hours. Glucose  Recent Labs Lab 06/19/15 1953  GLUCAP 200*    Imaging Ct Head Wo Contrast  06/19/2015   CLINICAL DATA:  RIGHT-sided weakness.  Code stroke.  EXAM: CT HEAD WITHOUT CONTRAST  TECHNIQUE: Contiguous axial images were obtained from the base of the skull through the vertex without intravenous contrast.  COMPARISON:  None.  FINDINGS: Acute hemorrhage is present in the LEFT basal ganglia extending into the LEFT lateral ventricle. This also extends through the foramen of Monro into the anterior RIGHT lateral ventricle and third ventricle. Mass effect is mild. The  hemorrhage measures 27 mm x 24 mm (image 14 series 201).  Tiny amount of blood tracks in of the fourth ventricle. Posterior fossa structures appear normal. 3 mm of LEFT-to-RIGHT midline shift is present.  Calvarium intact. Mastoid air cells clear. Intracranial atherosclerosis.  IMPRESSION: Acute LEFT basal ganglia hemorrhage with intraventricular breakthrough. No early hydrocephalus. 3 mm of LEFT-to- RIGHT midline shift.  Critical Value/emergent results were called by telephone at the time of interpretation on 06/19/2015 at 7:38 pm to Dr. Hosie Poisson, who verbally acknowledged these results.   Electronically Signed   By: Andreas Newport M.D.   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 P:   Sedation:  Propofol gtt / Fentanyl PRN. RASS goal: 0 to -1. Daily WUA. Neuro following. Repeat CT head in the AM.  PULMONARY OETT 9/26 >>> A: VDRF due to inability to protect airway in the setting of acute ICH. P:   Full mechanical support, wean as able. VAP prevention measures. SBT in AM if able. Albuterol PRN wheezing. CXR in AM.  CARDIOVASCULAR A:  Hypertensive emergency with acute ICH. P:  Cardene gtt for goal SBP < 160. Labetalol PRN. Will need outpatient follow up as well as outpatient antihypertensive regimen.  RENAL A:   Hypocalcemia. P:   NS @ 75. 1g Ca gluconate. BMP in AM.  GASTROINTESTINAL A:   GI prophylaxis. Nutrition. P:   SUP: Pantoprazole. NPO.  HEMATOLOGIC A:   VTE Prophylaxis. P:  SCD's only. CBC in AM.  INFECTIOUS A:   No indication of infection. P:   Monitor clinically.  ENDOCRINE A:   Hyperglycemia - no hx DM.  P:   SSI. Check Hgb A1c.   Family updated: Husband and sister in law.  Interdisciplinary Family Meeting v Palliative Care Meeting:  Due by: 10/2.  CC time:  35 minutes.  Rutherford Guys, [place] [facility]  Medicine Pager: [phone]  or [phone] 06/19/2015, 9:16 PM  Attending Note:  44 year old with history of HTN presenting with hypertensive emergency and left sided basal ganglia hemorrhagic CVA with associated swelling.  Patient was unable to protect her airway and was intubated.  Lungs are clear on exam.  I reviewed CXR myself and no acute disease with ETT in place.  Will continue labetalol for BP control.  Will place TLC today for 3% saline per neuro recommendations and will f/u.  Maintain on vent for now.  The patient is critically ill with multiple organ systems failure and requires high complexity decision making for assessment and support, frequent evaluation and titration of therapies, application of advanced monitoring technologies and  extensive interpretation of multiple databases.   Critical Care Time devoted to patient care services described in this note is  35  Minutes. This time reflects time of care of this signee Dr Jennet Maduro. This critical care time does not reflect procedure time, or teaching time or supervisory time of PA/NP/Med student/Med Resident etc but could involve care discussion time.  Rush Farmer, M.D. [facility] Pulmonary/Critical Care Medicine. Pager: [phone]. After hours pager: [phone].

## 2015-06-19 NOTE — ED Notes (Signed)
CBG 200 

## 2015-06-20 ENCOUNTER — Inpatient Hospital Stay (HOSPITAL_COMMUNITY): Payer: Medicaid Other

## 2015-06-20 DIAGNOSIS — J9601 Acute respiratory failure with hypoxia: Secondary | ICD-10-CM

## 2015-06-20 DIAGNOSIS — I6789 Other cerebrovascular disease: Secondary | ICD-10-CM

## 2015-06-20 DIAGNOSIS — G936 Cerebral edema: Secondary | ICD-10-CM

## 2015-06-20 LAB — LIPID PANEL
Cholesterol: 176 mg/dL (ref 0–200)
HDL: 53 mg/dL (ref >40)
LDL CALC: 109 mg/dL — AB (ref 0–99)
Total CHOL/HDL Ratio: 3.3 RATIO
Triglycerides: 69 mg/dL (ref <150)
VLDL: 14 mg/dL (ref 0–40)

## 2015-06-20 LAB — BASIC METABOLIC PANEL
Anion gap: 8 (ref 5–15)
BUN: 21 mg/dL — AB (ref 6–20)
CALCIUM: 8.2 mg/dL — AB (ref 8.9–10.3)
CHLORIDE: 105 mmol/L (ref 101–111)
CO2: 23 mmol/L (ref 22–32)
CREATININE: 1.03 mg/dL — AB (ref 0.44–1.00)
GFR calc Af Amer: >60 mL/min (ref >60)
GFR calc non Af Amer: >60 mL/min (ref >60)
GLUCOSE: 74 mg/dL (ref 65–99)
Potassium: 4.1 mmol/L (ref 3.5–5.1)
Sodium: 136 mmol/L (ref 135–145)

## 2015-06-20 LAB — CBC
HCT: 41.1 % (ref 36.0–46.0)
Hemoglobin: 13.7 g/dL (ref 12.0–15.0)
MCH: 29.4 pg (ref 26.0–34.0)
MCHC: 33.3 g/dL (ref 30.0–36.0)
MCV: 88.2 fL (ref 78.0–100.0)
PLATELETS: 190 10*3/uL (ref 150–400)
RBC: 4.66 MIL/uL (ref 3.87–5.11)
RDW: 13.6 % (ref 11.5–15.5)
WBC: 6.6 10*3/uL (ref 4.0–10.5)

## 2015-06-20 LAB — MAGNESIUM: Magnesium: 1.9 mg/dL (ref 1.7–2.4)

## 2015-06-20 LAB — TSH: TSH: 0.641 u[IU]/mL (ref 0.350–4.500)

## 2015-06-20 LAB — MRSA PCR SCREENING: MRSA BY PCR: NEGATIVE

## 2015-06-20 LAB — SODIUM
SODIUM: 139 mmol/L (ref 135–145)
Sodium: 137 mmol/L (ref 135–145)
Sodium: 142 mmol/L (ref 135–145)

## 2015-06-20 LAB — GLUCOSE, CAPILLARY
GLUCOSE-CAPILLARY: 91 mg/dL (ref 65–99)
Glucose-Capillary: 94 mg/dL (ref 65–99)

## 2015-06-20 LAB — POCT PREGNANCY, URINE: Preg Test, Ur: NEGATIVE

## 2015-06-20 LAB — PHOSPHORUS: Phosphorus: 4.6 mg/dL (ref 2.5–4.6)

## 2015-06-20 MED ORDER — ADULT MULTIVITAMIN W/MINERALS CH
1.0000 | ORAL_TABLET | Freq: Every day | ORAL | Status: DC
Start: 2015-06-20 — End: 2015-06-29
  Administered 2015-06-20 – 2015-06-29 (×10): 1
  Filled 2015-06-20 (×10): qty 1

## 2015-06-20 MED ORDER — PRO-STAT SUGAR FREE PO LIQD
30.0000 mL | Freq: Three times a day (TID) | ORAL | Status: DC
  Administered 2015-06-20 – 2015-06-22 (×4): 30 mL
  Filled 2015-06-20 (×4): qty 30

## 2015-06-20 MED ORDER — SODIUM CHLORIDE 3 % IV SOLN
INTRAVENOUS | Status: DC
  Administered 2015-06-20 – 2015-06-23 (×5): 50 mL/h via INTRAVENOUS
  Filled 2015-06-20 (×12): qty 500

## 2015-06-20 MED ORDER — SODIUM CHLORIDE 0.9 % IV BOLUS (SEPSIS)
500.0000 mL | Freq: Once | INTRAVENOUS | Status: AC
  Administered 2015-06-20: 500 mL via INTRAVENOUS

## 2015-06-20 MED ORDER — VITAL HIGH PROTEIN PO LIQD
1000.0000 mL | ORAL | Status: DC
  Administered 2015-06-20: 19:00:00
  Administered 2015-06-20 (×2): 1000 mL
  Filled 2015-06-20 (×6): qty 1000

## 2015-06-20 NOTE — Progress Notes (Signed)
SLP Cancellation Note  Patient Details Name: Katie Nichols MRN: [id number] DOB: Feb 10, 1971   Cancelled treatment:       Reason Eval/Treat Not Completed: Patient not medically ready. Pt intubated.  Riccardo Dubin, Student-SLP  Riccardo Dubin 06/20/2015, 8:29 AM

## 2015-06-20 NOTE — Progress Notes (Signed)
eLink Physician-Brief Progress Note Patient Name: Katie Nichols DOB: 09-Feb-1971 MRN: [id number]   Date of Service  06/20/2015  HPI/Events of Note  QTc interval has gone from 0.48 yesterday >> 0.53 today. Review of medications reveal Propofol and Albuterol as potential causes. However, she has been off Propofol since yesterday and has not received PRN Albuterol today. Other potential causes are hypokalemia, hypomagnesemia and hypocalcemia. K+ = 4.1, Ca++ = 8.2 and Mg++ = 1.9. Therefore, an electrolyte etiology appears unlikely.   eICU Interventions  I see no medications that she is actively receiving that would prolong QTc interval. Continue to monitor Qtc interval.      Intervention Category Intermediate Interventions: Arrhythmia - evaluation and management  Sommer,Steven Eugene 06/20/2015, 3:38 PM

## 2015-06-20 NOTE — Progress Notes (Signed)
STROKE TEAM PROGRESS NOTE  HPI Katie Nichols is a 44 y.o. female hx of HTN, asthma presenting as code stroke with acute onset of right sided weakness and sensory deficits. LSW 1640 when she initially noted shortness of breath followed by weakness and numbness on her right side. Code stroke activated. On EMS arrival noted to have BP of 280/180 with HR in the 120s. Patient reports history of HTN but has not been to a doctor in years, on no antihypertensives. Not on any antiplatelet or anticoagulant.   CT head imaging reviewed shows left basal ganglia ICH with minimal intra-ventricular extension with around 3mm midline shift. No signs of early hydrocephalus. Initial NIHSS of 12  Date last known well: 9/26 Time last known well: 1630 tPA Given: No, ICH Modified Rankin: Rankin Score=0  ICH Score: 1   SUBJECTIVE (INTERVAL HISTORY) Her ex-husband is at the bedside.  Overall she feels her condition is stable. Her repeat CT head this am showed stable left thalamic hematoma but with more prominent midline shift.   OBJECTIVE Temp:  [96.6 F (35.9 C)-98.4 F (36.9 C)] 98.4 F (36.9 C) (09/27 0700) Pulse Rate:  [59-103] 79 (09/27 0727) Cardiac Rhythm:  [-] Normal sinus rhythm (09/26 2100) Resp:  [14-30] 16 (09/27 0727) BP: (79-251)/(51-165) 111/64 mmHg (09/27 0700) SpO2:  [90 %-100 %] 99 % (09/27 0727) Arterial Line BP: (73-138)/(49-71) 138/71 mmHg (09/27 0700) FiO2 (%):  [40 %-100 %] 40 % (09/27 0727) Weight:  [90.9 kg (200 lb 6.4 oz)-117.935 kg (260 lb)] 90.9 kg (200 lb 6.4 oz) (09/26 2053)  CBC:  Recent Labs Lab 06/19/15 1918 06/19/15 1922 06/20/15 0400  WBC 6.8  --  6.6  NEUTROABS 4.3  --   --   HGB 14.7 16.0* 13.7  HCT 43.1 47.0* 41.1  MCV 86.2  --  88.2  PLT 199  --  190    Basic Metabolic Panel:  Recent Labs Lab 06/19/15 1918 06/19/15 1922 06/20/15 0400  NA 136 138 136  K 3.8 3.7 4.1  CL 106 104 105  CO2 19*  --  23  GLUCOSE 105* 107* 74  BUN 17 22* 21*   CREATININE 0.66 0.60 1.03*  CALCIUM 9.5  --  8.2*  MG  --   --  1.9  PHOS  --   --  4.6    Lipid Panel:    Component Value Date/Time   TRIG 183* 06/19/2015 2119   HgbA1c: No results found for: HGBA1C Urine Drug Screen: No results found for: LABOPIA, COCAINSCRNUR, LABBENZ, AMPHETMU, THCU, LABBARB    IMAGING I have personally reviewed the radiological images below and agree with the radiology interpretations. Blue text is my interpretation.  Ct Head Wo Contrast 06/20/2015    1. Unchanged left thalamic hematoma.  2. Newly seen right lateral ventricle and interpeduncular fossa hemorrhage is likely redistribution. Overall, intraventricular hematoma volume is similar to yesterday. However, more prominent midline shift comparing with last CT head. 3. Stable mild dilation of the temporal horns.     Ct Head Wo Contrast 06/19/2015    Acute LEFT basal ganglia hemorrhage with intraventricular breakthrough. No early hydrocephalus. 3 mm of LEFT-to- RIGHT midline shift.     Dg Chest Port 1 View 06/19/2015    1. Endotracheal tube tip 26 mm from the carina, in good position.  2. Enteric tube tip not visible.  3. No focal consolidation. Suggestion of pulmonary vascular congestion.     2D ehco - pending   PHYSICAL EXAM  Temp:  [96.6 F (35.9 C)-98.6 F (37 C)] 98.6 F (37 C) (09/27 0800) Pulse Rate:  [59-103] 71 (09/27 0800) Resp:  [14-30] 16 (09/27 0800) BP: (79-251)/(51-165) 109/68 mmHg (09/27 0800) SpO2:  [90 %-100 %] 97 % (09/27 0800) Arterial Line BP: (73-138)/(49-71) 136/67 mmHg (09/27 0800) FiO2 (%):  [40 %-100 %] 40 % (09/27 0727) Weight:  [200 lb 6.4 oz (90.9 kg)-260 lb (117.935 kg)] 200 lb 6.4 oz (90.9 kg) (09/26 2053)  General - obese, well developed, intubated, drowsy.  Ophthalmologic - Fundi not visualized due to noncooperation.  Cardiovascular - Regular rate and rhythm with no murmur.  Neuro - intubated, drowsy, open eyes on voice, able to follow some simple  commands, such as "open eyes", "show fist" and "thumbs up". Did not show two fingers on command. PERRL, partial palsy of right eye abduction, left eye attends to both sides, not blinking to visual threat on the right, right facial droop, RUE and RLE 0/5 even on pain stimulation, right side decreased sensation with pain stimulation, LUE and LLE spontaneous movement against gravity, right babinski positive, DTR 1+. Not cooperative on coordination test.   ASSESSMENT/PLAN [name] is a 44 y.o. female with history of hypertension and asthma presenting with right-sided weakness and sensory deficits with blood pressure  280/180. She did not receive IV t-PA due to acute left basal ganglia hemorrhage.  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 Also needs to rule out AVM.   Resultant  Right hemiplegia  MRI / MRA not ordered yet - needs to rule out AVM  Repeat CT head showed stable hematoma  Carotid Doppler not ordered yet  UDS - pending  2D Echo pending  LDL pending  HgbA1c pending  VTE prophylaxis SCDs  Diet NPO time specified  no antithrombotic prior to admission, now on no antithrombotic secondary to hemorrhage  Ongoing aggressive stroke risk factor management  Therapy recommendations: Pending  Disposition: Pending  Cerebral edema  Although not large volume ICH, however, medial thalamic ICH causing significant midline shift and uncal herniation  Needs central line and 3% saline  CCM will place central line  Na goal 150-155  Repeat CT in am  Hypertension  BP on the low side however, on admission 280/180  As per ex-husband, pt has HA for the last month, but did not check BP at home  Intravenous Cardene - currently off due to low BP  BP goal < 160 On fentanyl drip  Other Stroke Risk Factors  Obesity, Body mass index is 32.36 kg/(m^2).   Other Active Problems  Mildly elevated BUN and  creatinine  intubated  Hospital day # 1  This patient is critically ill due to left thalamic ICH with ventricular extension needing intubation, uncontrolled BP and at significant risk of neurological worsening, death form enlargement of hematoma, obstructive hydrocephalus, cerebral edma and brain herniation. This patient's care requires constant monitoring of vital signs, hemodynamics, respiratory and cardiac monitoring, review of multiple databases, neurological assessment, discussion with family, other specialists and medical decision making of high complexity. I spent 40 minutes of neurocritical care time in the care of this patient.   Marvel Plan, MD PhD Stroke Neurology 06/20/2015 9:22 AM   To contact Stroke Continuity provider, please refer to [email or web address]. After hours, contact General Neurology

## 2015-06-20 NOTE — Progress Notes (Signed)
Pt transported to and from CT on ventilator. Pt tolerated well

## 2015-06-20 NOTE — Progress Notes (Signed)
Notified CCM of continued hypotension despite d/c propofol.  Orders received to give NS bolus and notify of MAP <70.  Pt is more calm at this time and able to follow commands.  Will continue to monitor.

## 2015-06-20 NOTE — Progress Notes (Signed)
Chaplain was called to accompany family to 251[facility] from the ED waiting room.    Patient's husband was present plus his sister.  I directed them to 3rd floor waiting room, then alerted the RN and MD in 251M where the family was sitting.  Chaplain accompanied PA to talk with family, then sat with family for 20 minutes while they vented/grieved.  Husband lost his father 6 months, post-stroke.  Spiritual care office will follow.  Rev. [place], [place] [phone]

## 2015-06-20 NOTE — Progress Notes (Signed)
eLink Physician-Brief Progress Note Patient Name: Katie Nichols DOB: 1971-04-02 MRN: [id number]   Date of Service  06/20/2015  HPI/Events of Note  Off prop fent 300 Cal[place] But niow BP low side Neuro exam per Rn is improved  eICU Interventions  Avoid any free watre Bolus MAP goals likley to avoi d less than MAP 70 May need line, pressors     Intervention Category Intermediate Interventions: Hypertension - evaluation and management;Hypotension - evaluation and management  FEINSTEIN,DANIEL J. 06/20/2015, 2:06 AM

## 2015-06-20 NOTE — Progress Notes (Signed)
Initial Nutrition Assessment  DOCUMENTATION CODES:   Obesity unspecified  INTERVENTION:   Initiate Vital High Protein @ 40 ml/hr via OG tube.   30 ml Prostat TID.    Tube feeding regimen provides 1260 kcal, 129 grams of protein, and 802 ml of H2O.   NUTRITION DIAGNOSIS:   Inadequate oral intake related to inability to eat as evidenced by NPO status.  GOAL:   Provide needs based on ASPEN/SCCM guidelines  MONITOR:   TF tolerance, Vent status, Labs, I & O's  REASON FOR ASSESSMENT:   Consult Enteral/tube feeding initiation and management  ASSESSMENT:   Pt with hx of untreated HTN admitted after ICH.   Patient is currently intubated on ventilator support MV: 7.8 L/min Temp (24hrs), Avg:97.8 F (36.6 C), Min:96.6 F (35.9 C), Max:98.8 F (37.1 C)  Nutrition-Focused physical exam completed. Findings are no fat depletion, no muscle depletion, and no edema.  Pt discussed during ICU rounds and with RN.    Diet Order:  Diet NPO time specified  Skin:  Reviewed, no issues  Last BM:  unknown  Height:   Ht Readings from Last 1 Encounters:  06/19/15  (1.676 m)   Weight:   Wt Readings from Last 1 Encounters:  06/19/15 200 lb 6.4 oz (90.9 kg)   Ideal Body Weight:  59 kg  BMI:  Body mass index is 32.36 kg/(m^2).  Estimated Nutritional Needs:   Kcal:  5366-4403  Protein:  118  Fluid:  > 1.5 L/day  EDUCATION NEEDS:   No education needs identified at this time  Kendell Bane RD, LDN, CNSC [phone] Pager [phone] After Hours Pager

## 2015-06-20 NOTE — Progress Notes (Signed)
Pt's father arrived from out of town. Dr. Roda Shutters came to bedside to speak with him.

## 2015-06-20 NOTE — Procedures (Signed)
Arterial Catheter Insertion Procedure Note Katie Nichols [id number] 08-02-1971  Procedure: Insertion of Arterial Catheter  Indications: Blood pressure monitoring and Frequent blood sampling  Procedure Details Consent: Unable to obtain consent because of emergent medical necessity. Time Out: Verified patient identification, verified procedure, site/side was marked, verified correct patient position, special equipment/implants available, medications/allergies/relevent history reviewed, required imaging and test results available.  Performed  Maximum sterile technique was used including antiseptics, cap, gloves, gown, hand hygiene, mask and sheet. Skin prep: Chlorhexidine; local anesthetic administered 20 gauge catheter was inserted into left radial artery using the Seldinger technique.  Evaluation Blood flow good; BP tracing good. Complications: No apparent complications.   Koren Bound 06/20/2015

## 2015-06-20 NOTE — Progress Notes (Signed)
Pt not tolerating propofol for sedation with frequent drops in BP despite titration efforts.  Although, with propofol off, pt reaching for ET tube with left hand.  CCM notified and ordered to max out Fentanyl drip and stop propofol.  If pt still reaching for tube to call back.  Will continue to monitor.   Bosie Helper 06/20/2015 12:47 AM

## 2015-06-20 NOTE — Progress Notes (Signed)
OT Cancellation Note  Patient Details Name: Katie Nichols MRN: [id number] DOB: 06-24-1971   Cancelled Treatment:    Reason Eval/Treat Not Completed: Patient not medically ready (Pt intubated with bed rest orders.)  Evern Bio 06/20/2015, 8:18 AM

## 2015-06-20 NOTE — Progress Notes (Addendum)
Approximately 17:00 Fentanyl empty & paused while waiting for new bag. Pt was alert, then vomited large amount bile/clear emesis UTM. Pt neurologically same. gown & linens changed and pt cleaned. Oral & ETT suction. OGT  to suction per nursing judgment, very minimal suctioned in tube. Pt denies any nausea and is resting comfortably. Fentanyl infusing at previous rate. Neuro MD notified of vomiting episode as this was the first one noted today. Per neuro, may continue with plans to hang TF. OGT off suction. Will continue to monitor.

## 2015-06-20 NOTE — Procedures (Signed)
Central Venous Catheter Insertion Procedure Note VERLENA MARLETTE [id number] 04-Jun-1971  Procedure: Insertion of Central Venous Catheter Indications: Drug and/or fluid administration  Procedure Details Consent: Risks of procedure as well as the alternatives and risks of each were explained to the (patient/caregiver).  Consent for procedure obtained. Time Out: Verified patient identification, verified procedure, site/side was marked, verified correct patient position, special equipment/implants available, medications/allergies/relevent history reviewed, required imaging and test results available.  Performed  Maximum sterile technique was used including antiseptics, cap, gloves, gown, hand hygiene, mask and sheet. Skin prep: Chlorhexidine; local anesthetic administered A antimicrobial bonded/coated triple lumen catheter was placed in the right internal jugular vein using the Seldinger technique.  Evaluation Blood flow good Complications: No apparent complications Patient did tolerate procedure well. Chest X-ray ordered to verify placement.  CXR: pending.  U/S used in placement.  YACOUB,WESAM 06/20/2015, 11:28 AM

## 2015-06-20 NOTE — Progress Notes (Signed)
PT Cancellation Note  Patient Details Name: Katie Nichols MRN: [id number] DOB: 1971-07-05   Cancelled Treatment:    Reason Eval/Treat Not Completed: Patient not medically ready.  Intubated and probably proceeding to 3%.  Will see 9/28 as able. 06/20/2015  [place] Bing, PT [phone]  (pager)   Mottinger, Eliseo Gum 06/20/2015, 10:35 AM

## 2015-06-20 NOTE — Progress Notes (Signed)
Utilization Review Completed.Nichols, Katie T9/27/2016  

## 2015-06-20 NOTE — Procedures (Signed)
Arterial Catheter Insertion Procedure Note JOSSETTE ZIRBEL [id number] 12/23/1970  Procedure: Insertion of Arterial Catheter  Indications: Blood pressure monitoring and Frequent blood sampling  Procedure Details Consent: Unable to obtain consent because of altered level of consciousness. Time Out: Verified patient identification, verified procedure, site/side was marked, verified correct patient position, special equipment/implants available, medications/allergies/relevent history reviewed, required imaging and test results available.  Performed  Maximum sterile technique was used including antiseptics, cap, gloves, gown, hand hygiene, mask and sheet. Skin prep: Chlorhexidine; local anesthetic administered 20 gauge catheter was inserted into right radial artery using the Seldinger technique.  Evaluation Blood flow good; BP tracing good. Complications: No apparent complications.   Inez Pilgrim 06/20/2015

## 2015-06-20 NOTE — Progress Notes (Signed)
  Echocardiogram 2D Echocardiogram Without Contrast has been performed.  Katie Nichols 06/20/2015, 9:39 AM

## 2015-06-21 ENCOUNTER — Inpatient Hospital Stay (HOSPITAL_COMMUNITY): Payer: Medicaid Other

## 2015-06-21 ENCOUNTER — Encounter (HOSPITAL_COMMUNITY): Payer: Self-pay | Admitting: Radiology

## 2015-06-21 DIAGNOSIS — E669 Obesity, unspecified: Secondary | ICD-10-CM

## 2015-06-21 DIAGNOSIS — E785 Hyperlipidemia, unspecified: Secondary | ICD-10-CM

## 2015-06-21 LAB — COMPREHENSIVE METABOLIC PANEL
ALBUMIN: 3.5 g/dL (ref 3.5–5.0)
ALK PHOS: 50 U/L (ref 38–126)
ALT: 15 U/L (ref 14–54)
ANION GAP: 7 (ref 5–15)
AST: 16 U/L (ref 15–41)
BUN: 12 mg/dL (ref 6–20)
CO2: 29 mmol/L (ref 22–32)
Calcium: 9 mg/dL (ref 8.9–10.3)
Chloride: 110 mmol/L (ref 101–111)
Creatinine, Ser: 0.44 mg/dL (ref 0.44–1.00)
GFR calc Af Amer: >60 mL/min (ref >60)
GFR calc non Af Amer: >60 mL/min (ref >60)
GLUCOSE: 151 mg/dL — AB (ref 65–99)
POTASSIUM: 2.8 mmol/L — AB (ref 3.5–5.1)
SODIUM: 146 mmol/L — AB (ref 135–145)
Total Bilirubin: 0.4 mg/dL (ref 0.3–1.2)
Total Protein: 7.1 g/dL (ref 6.5–8.1)

## 2015-06-21 LAB — GLUCOSE, CAPILLARY
GLUCOSE-CAPILLARY: 125 mg/dL — AB (ref 65–99)
Glucose-Capillary: 104 mg/dL — ABNORMAL HIGH (ref 65–99)
Glucose-Capillary: 121 mg/dL — ABNORMAL HIGH (ref 65–99)
Glucose-Capillary: 130 mg/dL — ABNORMAL HIGH (ref 65–99)
Glucose-Capillary: 130 mg/dL — ABNORMAL HIGH (ref 65–99)
Glucose-Capillary: 148 mg/dL — ABNORMAL HIGH (ref 65–99)
Glucose-Capillary: 150 mg/dL — ABNORMAL HIGH (ref 65–99)

## 2015-06-21 LAB — SODIUM, URINE, RANDOM: Sodium, Ur: 161 mmol/L

## 2015-06-21 LAB — HEMOGLOBIN A1C
Hgb A1c MFr Bld: 5.6 % (ref 4.8–5.6)
MEAN PLASMA GLUCOSE: 114 mg/dL

## 2015-06-21 LAB — SODIUM
Sodium: 145 mmol/L (ref 135–145)
Sodium: 145 mmol/L (ref 135–145)
Sodium: 145 mmol/L (ref 135–145)

## 2015-06-21 LAB — BASIC METABOLIC PANEL
Anion gap: 6 (ref 5–15)
BUN: 21 mg/dL — AB (ref 6–20)
CALCIUM: 8.3 mg/dL — AB (ref 8.9–10.3)
CHLORIDE: 115 mmol/L — AB (ref 101–111)
CO2: 23 mmol/L (ref 22–32)
CREATININE: 0.54 mg/dL (ref 0.44–1.00)
GFR calc Af Amer: >60 mL/min (ref >60)
GFR calc non Af Amer: >60 mL/min (ref >60)
GLUCOSE: 121 mg/dL — AB (ref 65–99)
Potassium: 3.7 mmol/L (ref 3.5–5.1)
Sodium: 144 mmol/L (ref 135–145)

## 2015-06-21 LAB — CBC
HEMATOCRIT: 36.3 % (ref 36.0–46.0)
HEMOGLOBIN: 11.6 g/dL — AB (ref 12.0–15.0)
MCH: 28.4 pg (ref 26.0–34.0)
MCHC: 32 g/dL (ref 30.0–36.0)
MCV: 89 fL (ref 78.0–100.0)
Platelets: 142 10*3/uL — ABNORMAL LOW (ref 150–400)
RBC: 4.08 MIL/uL (ref 3.87–5.11)
RDW: 13.6 % (ref 11.5–15.5)
WBC: 5.2 10*3/uL (ref 4.0–10.5)

## 2015-06-21 LAB — RAPID URINE DRUG SCREEN, HOSP PERFORMED
AMPHETAMINES: NOT DETECTED
BENZODIAZEPINES: NOT DETECTED
Barbiturates: NOT DETECTED
Cocaine: NOT DETECTED
OPIATES: NOT DETECTED
TETRAHYDROCANNABINOL: POSITIVE — AB

## 2015-06-21 LAB — MAGNESIUM: Magnesium: 2 mg/dL (ref 1.7–2.4)

## 2015-06-21 LAB — OSMOLALITY, URINE: OSMOLALITY UR: 492 mosm/kg (ref 390–1090)

## 2015-06-21 LAB — PHOSPHORUS: PHOSPHORUS: 2.1 mg/dL — AB (ref 2.5–4.6)

## 2015-06-21 MED ORDER — LISINOPRIL 20 MG PO TABS
20.0000 mg | ORAL_TABLET | Freq: Two times a day (BID) | ORAL | Status: DC
  Administered 2015-06-22: 20 mg via ORAL
  Filled 2015-06-21: qty 1

## 2015-06-21 MED ORDER — AMLODIPINE BESYLATE 10 MG PO TABS
10.0000 mg | ORAL_TABLET | Freq: Every day | ORAL | Status: DC
  Administered 2015-06-22 – 2015-06-29 (×8): 10 mg via ORAL
  Filled 2015-06-21 (×8): qty 1

## 2015-06-21 MED ORDER — ATORVASTATIN CALCIUM 10 MG PO TABS
10.0000 mg | ORAL_TABLET | Freq: Every day | ORAL | Status: DC
  Administered 2015-06-22 – 2015-06-28 (×7): 10 mg via ORAL
  Filled 2015-06-21 (×7): qty 1

## 2015-06-21 MED ORDER — CLEVIDIPINE BUTYRATE 0.5 MG/ML IV EMUL
0.0000 mg/h | INTRAVENOUS | Status: DC
  Administered 2015-06-21: 20 mg/h via INTRAVENOUS
  Administered 2015-06-21: 1 mg/h via INTRAVENOUS
  Administered 2015-06-21: 15 mg/h via INTRAVENOUS
  Administered 2015-06-21: 14 mg/h via INTRAVENOUS
  Administered 2015-06-22 (×2): 25 mg/h via INTRAVENOUS
  Filled 2015-06-21: qty 100
  Filled 2015-06-21 (×4): qty 50
  Filled 2015-06-21 (×4): qty 100
  Filled 2015-06-21: qty 50
  Filled 2015-06-21 (×2): qty 100
  Filled 2015-06-21: qty 50

## 2015-06-21 NOTE — Progress Notes (Signed)
PULMONARY / CRITICAL CARE MEDICINE   Name: Katie Nichols MRN: [id number] DOB: 1971/04/08    ADMISSION DATE:  06/19/2015 CONSULTATION DATE:  06/19/15  REFERRING MD :  Roda Shutters  CHIEF COMPLAINT:  weakness  INITIAL PRESENTATION:  44 y.o. F brought to [facility] ED 9/26 as code stroke.  She was found to have left basal ganglia ICH and was intubated for airway protection.  PCCM called for vent management and BP control.    STUDIES:  CT head 9/26 >>> acute left basal ganglia hemorrhage with intraventricular breakthrough.  No early hydrocephalus.  3mm L to R MLS.  SIGNIFICANT EVENTS: 9/26 - admit with left ICH.  HISTORY OF PRESENT ILLNESS:  Pt is encephalopathic; therefore, this HPI is obtained from chart review. Katie Nichols is a 44 y.o. F with PMH of HTN (not on medications) who was brought to [facility] ED 9/26 as code stroke.  She had acute onset of right sided weakness and sensory deficits.  She was last seen normal earlier that evening (around 1640); however, she had SOB at that time for which EMS was dispatched.  On EMS arrival, pt was found on the floor with right sided weakness.  SBP on scene was 280/180.  In ED, CT of the head shows left basal ganglia ICH with minimal intraventricular extension with around 3mm midline shift.  No signs of hydrocephalus.  She was intubated for airway protection and PCCM was consulted for vent management.  SUBJECTIVE: No events overnight, awake and interactive.  VITAL SIGNS: Temp:  [97.3 F (36.3 C)-99.1 F (37.3 C)] 98.4 F (36.9 C) (09/28 0900) Pulse Rate:  [57-103] 81 (09/28 0900) Resp:  [10-21] 16 (09/28 0900) BP: (120-203)/(65-139) 159/83 mmHg (09/28 0900) SpO2:  [88 %-100 %] 97 % (09/28 0900) Arterial Line BP: (123-200)/(69-157) 128/109 mmHg (09/28 0450) FiO2 (%):  [40 %] 40 % (09/28 0715) Weight:  [93.1 kg (205 lb 4 oz)] 93.1 kg (205 lb 4 oz) (09/28 0600) HEMODYNAMICS:   VENTILATOR SETTINGS: Vent Mode:  [-] PRVC FiO2 (%):  [40 %] 40 % Set Rate:  [16  bmp-169 bmp] 16 bmp Vt Set:  [480 mL] 480 mL PEEP:  [5 cmH20] 5 cmH20 Plateau Pressure:  [12 cmH20-18 cmH20] 12 cmH20 INTAKE / OUTPUT: Intake/Output      09/27 0701 - 09/28 0700 09/28 0701 - 09/29 0700   I.V. (mL/kg) 1961.9 (21.1) 495.4 (5.3)   NG/GT 409 80   IV Piggyback     Total Intake(mL/kg) 2370.9 (25.5) 575.4 (6.2)   Urine (mL/kg/hr) 2345 (1) 700 (2.7)   Emesis/NG output 0 (0)    Total Output 2345 700   Net +25.9 -124.6        Emesis Occurrence 1 x     PHYSICAL EXAMINATION: General: Adult female, in NAD. Neuro: Alert and interactive, follows commands on the left only HEENT: [place]/AT. PERRL, sclerae anicteric. Cardiovascular: RRR, no M/R/G.  Lungs: Respirations even and unlabored.  Faint expiratory wheeze. Abdomen: BS x 4, soft, NT/ND.  Musculoskeletal: No gross deformities, no edema.  Skin: Intact, warm, no rashes.  LABS:  CBC  Recent Labs Lab 06/19/15 1918 06/19/15 1922 06/20/15 0400 06/21/15 0320  WBC 6.8  --  6.6 5.2  HGB 14.7 16.0* 13.7 11.6*  HCT 43.1 47.0* 41.1 36.3  PLT 199  --  190 142*   Coag's  Recent Labs Lab 06/19/15 1918  APTT 31  INR 1.01   BMET  Recent Labs Lab 06/19/15 1918 06/19/15 1922 06/20/15 0400  06/20/15  1500 06/20/15 2120 06/21/15 0320  NA 136 138 136  < > 139 142 144  K 3.8 3.7 4.1  --   --   --  3.7  CL 106 104 105  --   --   --  115*  CO2 19*  --  23  --   --   --  23  BUN 17 22* 21*  --   --   --  21*  CREATININE 0.66 0.60 1.03*  --   --   --  0.54  GLUCOSE 105* 107* 74  --   --   --  121*  < > = values in this interval not displayed. Electrolytes  Recent Labs Lab 06/19/15 1918 06/20/15 0400 06/21/15 0320  CALCIUM 9.5 8.2* 8.3*  MG  --  1.9 2.0  PHOS  --  4.6 2.1*   Sepsis Markers No results for input(s): LATICACIDVEN, PROCALCITON, O2SATVEN in the last 168 hours. ABG  Recent Labs Lab 06/19/15 2152  PHART 7.253*  PCO2ART 47.1*  PO2ART 564*   Liver Enzymes  Recent Labs Lab 06/19/15 1918  AST  19  ALT 13*  ALKPHOS 56  BILITOT 0.6  ALBUMIN 4.2   Cardiac Enzymes No results for input(s): TROPONINI, PROBNP in the last 168 hours. Glucose  Recent Labs Lab 06/19/15 1953 06/20/15 1641 06/20/15 2009 06/20/15 2335 06/21/15 0432 06/21/15 0724  GLUCAP 200* 94 91 104* 121* 130*    Imaging Ct Head Wo Contrast  06/21/2015   CLINICAL DATA:  Follow-up examination for intracranial hemorrhage.  EXAM: CT HEAD WITHOUT CONTRAST  TECHNIQUE: Contiguous axial images were obtained from the base of the skull through the vertex without intravenous contrast.  COMPARISON:  Prior study from 06/20/2015.  FINDINGS: Intraparenchymal hematoma centered at the left thalamus measures 2.4 x 2.5 cm, and not significantly changed relative to previous study. Extension into the adjacent white matter tracts again noted. Associated localized vasogenic edema about the hematoma is not significantly changed. Intraventricular extension with blood in the left lateral ventricle is walls the atrium of the right lateral ventricle again seen. Trace hemorrhage at the foramen of Monro. Trace hemorrhage also noted at the cerebral aqueduct. Overall, amount of intraventricular hemorrhage is slightly decreased. Stable dilatation of the temporal horns of the lateral ventricles. Stable 6 mm left-to-right shift at the level of the hematoma.  Previously seen hemorrhage within the inner right ocular cistern is less apparent, consistent with redistribution.  No other new intracranial process. Vascular calcifications again noted within the carotid siphons and distal vertebral arteries.  IMPRESSION: 1. Stable size of left thalamic hematoma with similar localized vasogenic edema. 2. Overall slight interval decrease in intraventricular hemorrhage with stable mild dilatation of the temporal horns. 3. Stable 6 mm left-to-right shift at the level of the hematoma.   Electronically Signed   By: Rise Mu M.D.   On: 06/21/2015 06:11   Dg Chest  Port 1 View  06/21/2015   CLINICAL DATA:  Hypertension.  Ventilator  EXAM: PORTABLE CHEST 1 VIEW  COMPARISON:  06/20/2015  FINDINGS: Support devices are stable. Heart is upper limits normal in size. Lungs are clear. No effusions or acute bony abnormality.  IMPRESSION: Stable support devices.  No acute findings.   Electronically Signed   By: Charlett Nose M.D.   On: 06/21/2015 07:40   Dg Chest Port 1 View  06/20/2015   CLINICAL DATA:  Central line placement.  EXAM: PORTABLE CHEST 1 VIEW  COMPARISON:  Same day.  FINDINGS: The heart size and mediastinal contours are within normal limits. Both lungs are clear. Endotracheal and nasogastric tubes are unchanged in position. No pneumothorax or pleural effusion is noted. Interval placement of right internal jugular catheter with distal tip overlying expected position of SVC. The visualized skeletal structures are unremarkable.  IMPRESSION: Interval placement of right internal jugular catheter with distal tip overlying expected position of the SVC. No pneumothorax is noted. Stable support apparatus.   Electronically Signed   By: Lupita Raider, M.D.   On: 06/20/2015 12:32     ASSESSMENT / PLAN:  NEUROLOGIC A:   Acute left basal ganglia ICH with IVH extension - likely hypertensive in etiology. P:   D/C all sedation. RASS goal: 0 to -1. Daily WUA. Neuro following. Repeat CT head improved.  PULMONARY OETT 9/26 >>> A: VDRF due to inability to protect airway in the setting of acute ICH. P:   SBT for potential extubation today once sedation wears off. VAP prevention measures. Albuterol PRN wheezing. CXR daily while intubated.  CARDIOVASCULAR A:  Hypertensive emergency with acute ICH. P:  Cleviprex gtt for goal SBP < 160. Labetalol PRN. Will need outpatient follow up as well as outpatient antihypertensive regimen. Will start PO if not able to extubate.  RENAL A:   Hypocalcemia. P:   NS @ 50. 1g Ca gluconate. BMP in  AM.  GASTROINTESTINAL A:   GI prophylaxis. Nutrition. P:   SUP: Pantoprazole. TF on hold for potential extubation today if patient wakes up more.  HEMATOLOGIC A:   VTE Prophylaxis. P:  SCD's only. CBC in AM.  INFECTIOUS A:   No indication of infection. P:   Monitor clinically.  ENDOCRINE A:   Hyperglycemia - no hx DM.  P:   SSI. Check Hgb A1c.  Family updated: No family bedside 9/28.  Interdisciplinary Family Meeting v Palliative Care Meeting:  Due by: 10/2.  The patient is critically ill with multiple organ systems failure and requires high complexity decision making for assessment and support, frequent evaluation and titration of therapies, application of advanced monitoring technologies and extensive interpretation of multiple databases.   Critical Care Time devoted to patient care services described in this note is  35  Minutes. This time reflects time of care of this signee Dr Koren Bound. This critical care time does not reflect procedure time, or teaching time or supervisory time of PA/NP/Med student/Med Resident etc but could involve care discussion time.  Alyson Reedy, M.D. [facility] Pulmonary/Critical Care Medicine. Pager: [phone]. After hours pager: [phone].

## 2015-06-21 NOTE — Progress Notes (Signed)
Pt continues to be hypertensive despite multiple doses of PRN labetalol.  Dr. Hosie Poisson notified and ordered to restart Cardene drip.  Will continue to monitor.    Bosie Helper 06/21/2015. 4:39 AM

## 2015-06-21 NOTE — Care Management Note (Signed)
Case Management Note  Patient Details  Name: Katie Nichols MRN: [id number] Date of Birth: Aug 02, 1971  Subjective/Objective:    Pt admitted on 06/19/15 with Lt basal ganglia ICH.  PTA, pt resided at home with family.                  Action/Plan: Will follow for discharge planning as pt progresses.   Pt extubated today; will need PT/OT consults when able to tolerate.    Expected Discharge Date:                  Expected Discharge Plan:  IP Rehab Facility  In-House Referral:     Discharge planning Services  CM Consult  Post Acute Care Choice:    Choice offered to:     DME Arranged:    DME Agency:     HH Arranged:    HH Agency:     Status of Service:  In process, will continue to follow  Medicare Important Message Given:    Date Medicare IM Given:    Medicare IM give by:    Date Additional Medicare IM Given:    Additional Medicare Important Message give by:     If discussed at Long Length of Stay Meetings, dates discussed:    Additional Comments:  Quintella Baton, RN, BSN  Trauma/Neuro ICU Case Manager [phone]

## 2015-06-21 NOTE — Progress Notes (Addendum)
STROKE TEAM PROGRESS NOTE  HPI Katie Nichols is a 44 y.o. female hx of HTN, asthma presenting as code stroke with acute onset of right sided weakness and sensory deficits. LSW 1640 when she initially noted shortness of breath followed by weakness and numbness on her right side. Code stroke activated. On EMS arrival noted to have BP of 280/180 with HR in the 120s. Patient reports history of HTN but has not been to a doctor in years, on no antihypertensives. Not on any antiplatelet or anticoagulant.   CT head imaging reviewed shows left basal ganglia ICH with minimal intra-ventricular extension with around 3mm midline shift. No signs of early hydrocephalus. Initial NIHSS of 12  Date last known well: 9/26 Time last known well: 1630 tPA Given: No, ICH Modified Rankin: Rankin Score=0  ICH Score: 1   SUBJECTIVE (INTERVAL HISTORY) No family is at the bedside.  Overall she feels her condition is stable. Her repeat CT head this am showed stable left thalamic hematoma and midline shift, no hydrocephalus. Pt following commands, still intubated. BP trending up and on cardene drip. Started TF. Na 144.    OBJECTIVE Temp:  [97.3 F (36.3 C)-99.1 F (37.3 C)] 98.2 F (36.8 C) (09/28 1122) Pulse Rate:  [57-103] 84 (09/28 1148) Cardiac Rhythm:  [-] Normal sinus rhythm (09/28 0800) Resp:  [10-21] 18 (09/28 1148) BP: (120-203)/(65-139) 174/85 mmHg (09/28 1148) SpO2:  [88 %-100 %] 97 % (09/28 1148) Arterial Line BP: (128-200)/(69-157) 128/109 mmHg (09/28 0450) FiO2 (%):  [40 %] 40 % (09/28 1148) Weight:  [205 lb 4 oz (93.1 kg)] 205 lb 4 oz (93.1 kg) (09/28 0600)  CBC:  Recent Labs Lab 06/19/15 1918  06/20/15 0400 06/21/15 0320  WBC 6.8  --  6.6 5.2  NEUTROABS 4.3  --   --   --   HGB 14.7  < > 13.7 11.6*  HCT 43.1  < > 41.1 36.3  MCV 86.2  --  88.2 89.0  PLT 199  --  190 142*  < > = values in this interval not displayed.  Basic Metabolic Panel:  Recent Labs Lab 06/20/15 0400   06/21/15 0320 06/21/15 0915  NA 136  < > 144 145  K 4.1  --  3.7  --   CL 105  --  115*  --   CO2 23  --  23  --   GLUCOSE 74  --  121*  --   BUN 21*  --  21*  --   CREATININE 1.03*  --  0.54  --   CALCIUM 8.2*  --  8.3*  --   MG 1.9  --  2.0  --   PHOS 4.6  --  2.1*  --   < > = values in this interval not displayed.  Lipid Panel:     Component Value Date/Time   CHOL 176 06/20/2015 2120   TRIG 69 06/20/2015 2120   HDL 53 06/20/2015 2120   CHOLHDL 3.3 06/20/2015 2120   VLDL 14 06/20/2015 2120   LDLCALC 109* 06/20/2015 2120   HgbA1c:  Lab Results  Component Value Date   HGBA1C 5.6 06/19/2015   Urine Drug Screen: No results found for: LABOPIA, COCAINSCRNUR, LABBENZ, AMPHETMU, THCU, LABBARB    IMAGING I have personally reviewed the radiological images below and agree with the radiology interpretations. Blue text is my interpretation.  Ct Head Wo Contrast  06/21/2015   IMPRESSION: 1. Stable size of left thalamic hematoma with similar localized  vasogenic edema. 2. Overall slight interval decrease in intraventricular hemorrhage with stable mild dilatation of the temporal horns. 3. Stable 6 mm left-to-right shift at the level of the hematoma.   06/20/2015    1. Unchanged left thalamic hematoma.  2. Newly seen right lateral ventricle and interpeduncular fossa hemorrhage is likely redistribution. Overall, intraventricular hematoma volume is similar to yesterday. However, more prominent midline shift comparing with last CT head. 3. Stable mild dilation of the temporal horns.     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 No early hydrocephalus. 3 mm of LEFT-to- RIGHT midline shift.    Dg Chest Port 1 View 06/19/2015    1. Endotracheal tube tip 26 mm from the carina, in good position.  2. Enteric tube tip not visible.  3. No focal consolidation. Suggestion of pulmonary vascular congestion.     2D ehco - - Left ventricle: The cavity  size was normal. Wall thickness was normal. Systolic function was normal. The estimated ejection fraction was in the range of 60% to 65%. Wall motion was normal; there were no regional wall motion abnormalities. Doppler parameters are consistent with abnormal left ventricular relaxation (grade 1 diastolic dysfunction). - Mitral valve: Transvalvular velocity was within the normal range. There was no evidence for stenosis. There was no regurgitation. - Right ventricle: The cavity size was normal. Wall thickness was normal. Systolic function was normal. - Tricuspid valve: There was trivial regurgitation. - Pulmonary arteries: PA peak pressure: 40 mm Hg (S). - Inferior vena cava: The vessel was normal in size. The respirophasic diameter changes were blunted (< 50%), consistent with normal central venous pressure.  Renal artery [place] - pending   PHYSICAL EXAM  Temp:  [97.3 F (36.3 C)-99.1 F (37.3 C)] 98.2 F (36.8 C) (09/28 1122) Pulse Rate:  [57-103] 84 (09/28 1148) Resp:  [10-21] 18 (09/28 1148) BP: (120-203)/(65-139) 174/85 mmHg (09/28 1148) SpO2:  [88 %-100 %] 97 % (09/28 1148) Arterial Line BP: (128-200)/(69-157) 128/109 mmHg (09/28 0450) FiO2 (%):  [40 %] 40 % (09/28 1148) Weight:  [205 lb 4 oz (93.1 kg)] 205 lb 4 oz (93.1 kg) (09/28 0600)  General - obese, well developed, intubated, awake alert.  Ophthalmologic - Fundi not visualized due to noncooperation.  Cardiovascular - Regular rate and rhythm with no murmur.  Neuro - intubated, awake alert, able to follow all simple commands. PERRL, EOMI, blinking to visual threat on the both sides, right facial droop, RUE and RLE 0/5 even on pain stimulation, right side decreased sensation with pain stimulation, LUE and LLE spontaneous movement against gravity, babinski mute b/l, DTR 1+. No ataxia on the LUE.   ASSESSMENT/PLAN Ms. DE LIBMAN is a 45 y.o. female with history of hypertension and asthma presenting  with right-sided weakness and sensory deficits with blood pressure  280/180. She did not receive IV t-PA due to acute left basal ganglia hemorrhage.  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 Also needs to rule out AVM.   Resultant  Right hemiplegia  MRI / MRA not ordered yet - needs to rule out AVM  Repeat CT head showed stable hematoma  Carotid Doppler not ordered yet  UDS - pending  2D Echo unremarkable  LDL 109  HgbA1c 5.6  VTE prophylaxis SCDs Diet NPO time specified  no antithrombotic prior to admission, now on no antithrombotic secondary to hemorrhage  Ongoing aggressive stroke risk factor management  Therapy recommendations: Pending  Disposition: Pending  Cerebral edema  Although not large volume ICH, however, medial thalamic ICH causing significant midline shift and uncal herniation  on 3% saline  Na 136 -> 144  Na goal 150-155  Repeat CT stable  Hypertension, malignant  BP on the high side now  On cardene drip, maxed out  Change to cleviprex for lowering volume intake  BP goal < 160 start po meds Check renal artery ultrasound Check renin, ADH and cathecholamines   Hyperlipidemia   Not on any home meds  LDL 109, goal < 70  Add lipitor    Continue statin on discharge.  Other Stroke Risk Factors  Obesity, Body mass index is 33.14 kg/(m^2).   Other Active Problems  Mildly elevated BUN and creatinine  intubated  Hospital day # 2  This patient is critically ill due to left thalamic ICH with ventricular extension needing intubation, uncontrolled BP and at significant risk of neurological worsening, death form enlargement of hematoma, obstructive hydrocephalus, cerebral edma and brain herniation. This patient's care requires constant monitoring of vital signs, hemodynamics, respiratory and cardiac monitoring, review of multiple databases, neurological assessment, discussion with  family, other specialists and medical decision making of high complexity. I spent 35 minutes of neurocritical care time in the care of this patient.   Marvel Plan, MD PhD Stroke Neurology 06/21/2015 11:54 AM   To contact Stroke Continuity provider, please refer to [email or web address]. After hours, contact General Neurology

## 2015-06-21 NOTE — Progress Notes (Signed)
Pt failed stroke swallow screen-see doc flowsheets. MD notified.

## 2015-06-21 NOTE — Procedures (Signed)
Extubation Procedure Note  Patient Details:   Name: SHREE ESPEY DOB: September 08, 1971 MRN: [id number]   Airway Documentation:  Airway 7.5 mm (Active)  Secured at (cm) 24 cm 06/21/2015 11:48 AM  Measured From Lips 06/21/2015 11:48 AM  Secured Location Center 06/21/2015 11:48 AM  Secured By [employer] 06/21/2015 11:48 AM  Tube Holder Repositioned Yes 06/21/2015 11:48 AM  Cuff Pressure (cm H2O) 26 cm H2O 06/20/2015  7:26 PM  Site Condition Dry 06/21/2015 11:48 AM    Evaluation  O2 sats: stable throughout Complications: No apparent complications Patient did tolerate procedure well. Bilateral Breath Sounds: Clear, Diminished Suctioning: Airway Yes   Positive cuff leak heard prior to extubation. Post extubation pt placed on nasal cannula 4 Lpm with humidity. Sat 97%.  Forest Becker Palmer 06/21/2015, 12:20 PM

## 2015-06-21 NOTE — Progress Notes (Addendum)
Verbal order for extubation per CCM, pt extubated at 1215.  Swallow screen to be completed at apx.1446.

## 2015-06-21 NOTE — Progress Notes (Signed)
Pt becoming progressively more drowsy this afternoon, level of orientation has fluctuated between assessments since extubation, although she is oriented x4 at this time/responsive to voice.  Pupillary response unchanged.  Pt has notable expressive aphasia, but can follow all commands when prompted.  Pt reports double vision, but no visual field loss.  MD notified, STAT head CT and CMET ordered.

## 2015-06-21 NOTE — Progress Notes (Signed)
PT Cancellation Note  Patient Details Name: Katie Nichols MRN: [id number] DOB: 07-25-1971   Cancelled Treatment:    Reason Eval/Treat Not Completed: Patient not medically ready.  Still await activity orders.  On bedrest at this time.  Will see as able 9/29. 06/21/2015  [place] Bing, PT [phone]  (pager)   Mottinger, Eliseo Gum 06/21/2015, 3:14 PM

## 2015-06-21 NOTE — Progress Notes (Signed)
OT Cancellation Note  Patient Details Name: Katie Nichols MRN: [id number] DOB: November 16, 1970   Cancelled Treatment:    Reason Eval/Treat Not Completed: Patient not medically ready Pt currently on bedrest. Please update activity orders when appropriate to initiate therapy. Thanks [facility] Ward, OTR/L  [phone] 06/21/2015 06/21/2015, 8:23 AM

## 2015-06-22 ENCOUNTER — Inpatient Hospital Stay (HOSPITAL_COMMUNITY): Payer: Medicaid Other

## 2015-06-22 ENCOUNTER — Encounter (HOSPITAL_COMMUNITY): Payer: Self-pay | Admitting: Physician Assistant

## 2015-06-22 DIAGNOSIS — E876 Hypokalemia: Secondary | ICD-10-CM

## 2015-06-22 DIAGNOSIS — R1314 Dysphagia, pharyngoesophageal phase: Secondary | ICD-10-CM

## 2015-06-22 DIAGNOSIS — I10 Essential (primary) hypertension: Secondary | ICD-10-CM

## 2015-06-22 DIAGNOSIS — I701 Atherosclerosis of renal artery: Secondary | ICD-10-CM

## 2015-06-22 LAB — CBC
HCT: 43.1 % (ref 36.0–46.0)
HEMOGLOBIN: 14.2 g/dL (ref 12.0–15.0)
MCH: 29.2 pg (ref 26.0–34.0)
MCHC: 32.9 g/dL (ref 30.0–36.0)
MCV: 88.5 fL (ref 78.0–100.0)
PLATELETS: 191 10*3/uL (ref 150–400)
RBC: 4.87 MIL/uL (ref 3.87–5.11)
RDW: 13.4 % (ref 11.5–15.5)
WBC: 9.2 10*3/uL (ref 4.0–10.5)

## 2015-06-22 LAB — BASIC METABOLIC PANEL
ANION GAP: 11 (ref 5–15)
BUN: 14 mg/dL (ref 6–20)
CHLORIDE: 110 mmol/L (ref 101–111)
CO2: 26 mmol/L (ref 22–32)
Calcium: 9.3 mg/dL (ref 8.9–10.3)
Creatinine, Ser: 0.52 mg/dL (ref 0.44–1.00)
GFR calc Af Amer: >60 mL/min (ref >60)
Glucose, Bld: 148 mg/dL — ABNORMAL HIGH (ref 65–99)
POTASSIUM: 2.8 mmol/L — AB (ref 3.5–5.1)
SODIUM: 147 mmol/L — AB (ref 135–145)

## 2015-06-22 LAB — GLUCOSE, CAPILLARY
GLUCOSE-CAPILLARY: 171 mg/dL — AB (ref 65–99)
Glucose-Capillary: 137 mg/dL — ABNORMAL HIGH (ref 65–99)
Glucose-Capillary: 153 mg/dL — ABNORMAL HIGH (ref 65–99)
Glucose-Capillary: 133 mg/dL — ABNORMAL HIGH (ref 65–99)

## 2015-06-22 LAB — SODIUM, URINE, RANDOM: SODIUM UR: 106 mmol/L

## 2015-06-22 LAB — PHOSPHORUS: Phosphorus: <1 mg/dL — CL (ref 2.5–4.6)

## 2015-06-22 LAB — OSMOLALITY, URINE: Osmolality, Ur: 554 mOsm/kg (ref 390–1090)

## 2015-06-22 LAB — SODIUM
SODIUM: 148 mmol/L — AB (ref 135–145)
SODIUM: 141 mmol/L (ref 135–145)
SODIUM: 148 mmol/L — AB (ref 135–145)

## 2015-06-22 LAB — MAGNESIUM: MAGNESIUM: 2.1 mg/dL (ref 1.7–2.4)

## 2015-06-22 MED ORDER — POTASSIUM CHLORIDE 20 MEQ PO PACK
40.0000 meq | PACK | Freq: Two times a day (BID) | ORAL | Status: DC
  Filled 2015-06-22: qty 2

## 2015-06-22 MED ORDER — HYDRALAZINE HCL 50 MG PO TABS
50.0000 mg | ORAL_TABLET | Freq: Three times a day (TID) | ORAL | Status: DC
  Administered 2015-06-22 – 2015-06-23 (×3): 50 mg via ORAL
  Filled 2015-06-22 (×3): qty 1

## 2015-06-22 MED ORDER — SODIUM CHLORIDE 1 G PO TABS
2.0000 g | ORAL_TABLET | Freq: Three times a day (TID) | ORAL | Status: DC
  Administered 2015-06-22 – 2015-06-23 (×2): 2 g via ORAL
  Filled 2015-06-22 (×5): qty 2

## 2015-06-22 MED ORDER — ESMOLOL HCL-SODIUM CHLORIDE 2000 MG/100ML IV SOLN
25.0000 ug/kg/min | INTRAVENOUS | Status: DC
  Administered 2015-06-22: 25 ug/kg/min via INTRAVENOUS
  Administered 2015-06-22 (×2): 300 ug/kg/min via INTRAVENOUS
  Administered 2015-06-22: 200 ug/kg/min via INTRAVENOUS
  Administered 2015-06-22: 75 ug/kg/min via INTRAVENOUS
  Administered 2015-06-23: 25 ug/kg/min via INTRAVENOUS
  Filled 2015-06-22 (×8): qty 100

## 2015-06-22 MED ORDER — POTASSIUM CHLORIDE 20 MEQ/15ML (10%) PO SOLN
40.0000 meq | Freq: Two times a day (BID) | ORAL | Status: DC
  Filled 2015-06-22: qty 30

## 2015-06-22 MED ORDER — POTASSIUM PHOSPHATES 15 MMOLE/5ML IV SOLN
30.0000 mmol | Freq: Once | INTRAVENOUS | Status: AC
  Administered 2015-06-22: 30 mmol via INTRAVENOUS
  Filled 2015-06-22: qty 10

## 2015-06-22 MED ORDER — POTASSIUM CHLORIDE 10 MEQ/50ML IV SOLN
10.0000 meq | INTRAVENOUS | Status: AC
  Administered 2015-06-22 (×2): 10 meq via INTRAVENOUS
  Filled 2015-06-22 (×2): qty 50

## 2015-06-22 MED ORDER — METOPROLOL TARTRATE 25 MG PO TABS
25.0000 mg | ORAL_TABLET | Freq: Two times a day (BID) | ORAL | Status: DC
  Administered 2015-06-22: 25 mg via ORAL
  Filled 2015-06-22: qty 1

## 2015-06-22 MED ORDER — CLEVIDIPINE BUTYRATE 0.5 MG/ML IV EMUL
0.0000 mg/h | INTRAVENOUS | Status: DC
  Administered 2015-06-22 (×3): 25 mg/h via INTRAVENOUS
  Administered 2015-06-22: 13 mg/h via INTRAVENOUS
  Administered 2015-06-22: 1 mg/h via INTRAVENOUS
  Administered 2015-06-23 – 2015-06-24 (×10): 25 mg/h via INTRAVENOUS
  Administered 2015-06-24: 10 mg/h via INTRAVENOUS
  Administered 2015-06-24: 25 mg/h via INTRAVENOUS
  Administered 2015-06-24: 15 mg/h via INTRAVENOUS
  Administered 2015-06-24: 10 mg/h via INTRAVENOUS
  Administered 2015-06-24 (×2): 25 mg/h via INTRAVENOUS
  Administered 2015-06-25: 5 mg/h via INTRAVENOUS
  Filled 2015-06-22 (×6): qty 100
  Filled 2015-06-22: qty 50
  Filled 2015-06-22 (×20): qty 100

## 2015-06-22 MED ORDER — POTASSIUM PHOSPHATES 15 MMOLE/5ML IV SOLN
40.0000 meq | Freq: Once | INTRAVENOUS | Status: AC
  Administered 2015-06-22: 40 meq via INTRAVENOUS
  Filled 2015-06-22 (×2): qty 9.09

## 2015-06-22 NOTE — Progress Notes (Addendum)
Preliminary results by tech - Renal Duplex Completed. Elevated velocities in the right renal artery are consistent with 60-99% stenosis. Left renal artery appeared within normal limits. Results given to patient's nurse.  Marilynne Halsted, BS, RDMS, RVT

## 2015-06-22 NOTE — Evaluation (Signed)
Speech Language Pathology Evaluation Patient Details Name: Katie Nichols MRN: [id number] DOB: 12-13-1970 Today's Date: 06/22/2015 Time: 4098-1191 SLP Time Calculation (min) (ACUTE ONLY): 19 min  Problem List:  Patient Active Problem List   Diagnosis Date Noted  . Acute respiratory failure with hypoxemia 06/20/2015  . ICH (intracerebral hemorrhage) 06/19/2015  . Encounter for intubation   . Hypertensive emergency   . Ventilator dependent   . Hypocalcemia    Past Medical History:  Past Medical History  Diagnosis Date  . Hypertension    Past Surgical History: No past surgical history on file. HPI:  Katie Nichols is a 44 y.o. female hx of HTN, asthma presenting with SOB and code stroke with acute onset of right sided weakness and sensory deficits. CT head imaging reviewed shows left basal ganglia ICH with minimal intra-ventricular extension with around 3mm midline shift. Intubated 9/26-9/28.    Assessment / Plan / Recommendation Clinical Impression  Cognitive-linguistic evaluation complete. Patient presents with a mild aphasia with both expressive and receptive components. Note patient with c/o blurred/double vision but did not impact reading function (large print) during today's exam. Patient will benefit from acute SLP f/u as well as SLP f/u after d/c to maximize communication abilities.     SLP Assessment  Patient needs continued Speech Lanaguage Pathology Services    Follow Up Recommendations  Inpatient Rehab    Frequency and Duration min 2x/week  2 weeks   Pertinent Vitals/Pain Pain Assessment: No/denies pain   SLP Goals  Potential to Achieve Goals (ACUTE ONLY): Good  SLP Evaluation Prior Functioning  Cognitive/Linguistic Baseline: Within functional limits   Cognition  Overall Cognitive Status: Within Functional Limits for tasks assessed (for basic tasks assessed) Arousal/Alertness: Lethargic Orientation Level: Oriented to person;Oriented to place    Comprehension  Auditory Comprehension Overall Auditory Comprehension: Impaired Yes/No Questions: Impaired Complex Questions: 75-100% accurate Commands: Impaired Multistep Basic Commands: 0-24% accurate Complex Commands: 0-24% accurate Interfering Components: Other (comment) (lethargy) Visual Recognition/Discrimination Discrimination: Within Function Limits Reading Comprehension Reading Status: Within funtional limits    Expression Expression Primary Mode of Expression: Verbal Verbal Expression Overall Verbal Expression: Impaired Initiation: No impairment Automatic Speech: Name;Social Response Level of Generative/Spontaneous Verbalization: Sentence Repetition: No impairment Naming: Impairment Responsive: 76-100% accurate Confrontation: Within functional limits Verbal Errors: Semantic paraphasias;Not aware of errors Pragmatics: Impairment Impairments: Eye contact;Abnormal affect;Monotone;Other (comment) (flat affect) Written Expression Dominant Hand: Right   Oral / Motor Oral Motor/Sensory Function Overall Oral Motor/Sensory Function: Impaired Labial ROM: Reduced right Labial Symmetry: Abnormal symmetry right Labial Strength: Reduced Labial Sensation: Reduced Lingual ROM: Within Functional Limits Lingual Symmetry: Abnormal symmetry right Lingual Strength: Reduced Lingual Sensation: Within Functional Limits Facial ROM: Reduced right Facial Symmetry: Right droop Facial Strength: Reduced Facial Sensation: Reduced Velum: Within Functional Limits Mandible: Within Functional Limits Motor Speech Overall Motor Speech: Appears within functional limits for tasks assessed   GO    Katie Nichols, CCC-SLP [phone]  Katie Lango Meryl 06/22/2015, 10:56 AM

## 2015-06-22 NOTE — Progress Notes (Signed)
STROKE TEAM PROGRESS NOTE   SUBJECTIVE (INTERVAL HISTORY) No family is at the bedside. She was extubated yesterday and tolerating well, but did not pass swallow. No po meds so far. Continued on cleviprex but maxed out. Changed to esmolol overnight maxed out but BP still high > 180s. Resumed cleviprex on top of esmolol this am.    OBJECTIVE Temp:  [97.7 F (36.5 C)-100 F (37.8 C)] 98.8 F (37.1 C) (09/29 0945) Pulse Rate:  [75-113] 85 (09/29 0945) Cardiac Rhythm:  [-] Normal sinus rhythm (09/29 0800) Resp:  [10-23] 18 (09/29 0945) BP: (155-205)/(77-125) 168/99 mmHg (09/29 0945) SpO2:  [90 %-100 %] 96 % (09/29 0945) FiO2 (%):  [40 %] 40 % (09/28 1200) Weight:  [194 lb 10.7 oz (88.3 kg)] 194 lb 10.7 oz (88.3 kg) (09/29 0500)  CBC:  Recent Labs Lab 06/19/15 1918  06/21/15 0320 06/22/15 0235  WBC 6.8  < > 5.2 9.2  NEUTROABS 4.3  --   --   --   HGB 14.7  < > 11.6* 14.2  HCT 43.1  < > 36.3 43.1  MCV 86.2  < > 89.0 88.5  PLT 199  < > 142* 191  < > = values in this interval not displayed.  Basic Metabolic Panel:  Recent Labs Lab 06/21/15 0320  06/21/15 1604  06/22/15 0235 06/22/15 0828  NA 144  < > 146*  < > 147* 148*  K 3.7  --  2.8*  --  2.8*  --   CL 115*  --  110  --  110  --   CO2 23  --  29  --  26  --   GLUCOSE 121*  --  151*  --  148*  --   BUN 21*  --  12  --  14  --   CREATININE 0.54  --  0.44  --  0.52  --   CALCIUM 8.3*  --  9.0  --  9.3  --   MG 2.0  --   --   --  2.1  --   PHOS 2.1*  --   --   --  <1.0*  --   < > = values in this interval not displayed.  Lipid Panel:     Component Value Date/Time   CHOL 176 06/20/2015 2120   TRIG 69 06/20/2015 2120   HDL 53 06/20/2015 2120   CHOLHDL 3.3 06/20/2015 2120   VLDL 14 06/20/2015 2120   LDLCALC 109* 06/20/2015 2120   HgbA1c:  Lab Results  Component Value Date   HGBA1C 5.6 06/19/2015   Urine Drug Screen:     Component Value Date/Time   LABOPIA NONE DETECTED 06/21/2015 1233   COCAINSCRNUR NONE  DETECTED 06/21/2015 1233   LABBENZ NONE DETECTED 06/21/2015 1233   AMPHETMU NONE DETECTED 06/21/2015 1233   THCU POSITIVE* 06/21/2015 1233   LABBARB NONE DETECTED 06/21/2015 1233      IMAGING I have personally reviewed the radiological images below and agree with the radiology interpretations. Blue text is my interpretation.  Ct Head Wo Contrast  06/21/2015   IMPRESSION: 1. Stable size of left thalamic hematoma with similar localized vasogenic edema. 2. Overall slight interval decrease in intraventricular hemorrhage with stable mild dilatation of the temporal horns. 3. Stable 6 mm left-to-right shift at the level of the hematoma.   06/20/2015    1. Unchanged left thalamic hematoma.  2. Newly seen right lateral ventricle and interpeduncular fossa hemorrhage is likely redistribution. Overall,  intraventricular hematoma volume is similar to yesterday. However, more prominent midline shift comparing with last CT head. 3. Stable mild dilation of the temporal horns.     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 No early hydrocephalus. 3 mm of LEFT-to- RIGHT midline shift.    Dg Chest Port 1 View 06/19/2015    1. Endotracheal tube tip 26 mm from the carina, in good position.  2. Enteric tube tip not visible.  3. No focal consolidation. Suggestion of pulmonary vascular congestion.     2D ehco - - Left ventricle: The cavity size was normal. Wall thickness was normal. Systolic function was normal. The estimated ejection fraction was in the range of 60% to 65%. Wall motion was normal; there were no regional wall motion abnormalities. Doppler parameters are consistent with abnormal left ventricular relaxation (grade 1 diastolic dysfunction). - Mitral valve: Transvalvular velocity was within the normal range. There was no evidence for stenosis. There was no regurgitation. - Right ventricle: The cavity size was normal. Wall thickness  was normal. Systolic function was normal. - Tricuspid valve: There was trivial regurgitation. - Pulmonary arteries: PA peak pressure: 40 mm Hg (S). - Inferior vena cava: The vessel was normal in size. The respirophasic diameter changes were blunted (< 50%), consistent with normal central venous pressure.  Renal artery [place] - pending   PHYSICAL EXAM  Temp:  [97.7 F (36.5 C)-100 F (37.8 C)] 98.8 F (37.1 C) (09/29 0945) Pulse Rate:  [75-113] 85 (09/29 0945) Resp:  [10-23] 18 (09/29 0945) BP: (155-205)/(77-125) 168/99 mmHg (09/29 0945) SpO2:  [90 %-100 %] 96 % (09/29 0945) FiO2 (%):  [40 %] 40 % (09/28 1200) Weight:  [194 lb 10.7 oz (88.3 kg)] 194 lb 10.7 oz (88.3 kg) (09/29 0500)  General - obese, well developed, awake alert.  Ophthalmologic - Fundi not visualized due to noncooperation.  Cardiovascular - Regular rate and rhythm with no murmur.  Neuro - intubated, awake alert, able to follow all simple commands, with transcortical motor aphasia. PERRL, EOMI, blinking to visual threat on the both sides, right facial droop, RUE and RLE 0/5 even on pain stimulation, right side decreased sensation with pain stimulation, LUE and LLE spontaneous movement against gravity, babinski mute b/l, DTR 1+. No ataxia on the LUE.   ASSESSMENT/[name] is a 44 y.o. female with history of hypertension and asthma presenting with right-sided weakness and sensory deficits with blood pressure  280/180. She did not receive IV t-PA due to acute left basal ganglia hemorrhage.   ICH with ventricular extension:  acute left thalamic hemorrhage likely secondary to hypertension although needs to rule out AVM.   Resultant  Right hemiplegia, transcortical aphasia.  MRI / MRA not ordered yet - needs to rule out AVM  Repeat CT head showed stable hematoma  Carotid Doppler not ordered yet  UDS - positive for THC  2D Echo unremarkable  LDL 109  HgbA1c 5.6  VTE prophylaxis SCDs Diet  NPO time specified  no antithrombotic prior to admission, now on no antithrombotic secondary to hemorrhage  Ongoing aggressive stroke risk factor management  Therapy recommendations: Pending  Disposition: Pending  Cerebral edema  Although not large volume ICH, however, medial thalamic ICH causing significant midline shift and uncal herniation  on 3% saline  Na 136 -> 144 -> 147 -> 148  Na goal 150-155  Repeat CT stable  Hypertension, malignant  BP on the high side now  On cleviprex  and esmolol drip  BP goal < 160 start po meds once pass swallow Check renal artery ultrasound Check renin, ADH and cathecholamines   Hyperlipidemia   Not on any home meds  LDL 109, goal < 70  Add lipitor    Continue statin on discharge.  Other Stroke Risk Factors  Obesity, Body mass index is 31.43 kg/(m^2).   Other Active Problems  Mildly elevated BUN and creatinine  Hospital day # 3  This patient is critically ill due to left thalamic ICH with ventricular extension, uncontrolled BP and at significant risk of neurological worsening, death form enlargement of hematoma, obstructive hydrocephalus, cerebral edma and brain herniation. This patient's care requires constant monitoring of vital signs, hemodynamics, respiratory and cardiac monitoring, review of multiple databases, neurological assessment, discussion with family, other specialists and medical decision making of high complexity. I spent 35 minutes of neurocritical care time in the care of this patient.   Katie Plan, MD PhD Stroke Neurology 06/22/2015 10:47 AM   To contact Stroke Continuity provider, please refer to [email or web address]. After hours, contact General Neurology

## 2015-06-22 NOTE — Progress Notes (Signed)
OT Cancellation Note  Patient Details Name: Katie Nichols MRN: [id number] DOB: Apr 26, 1971   Cancelled Treatment:    Reason Eval/Treat Not Completed: Other (comment);Patient not medically ready Pt has active bedrest orders. Please update activity orders when appropriate for therapy. Thanks [facility] Ward, OTR/L  [phone] 06/22/2015 06/22/2015, 10:32 AM

## 2015-06-22 NOTE — Progress Notes (Signed)
MBSS complete. Full report located under chart review in imaging section.  Nicholai Willette MA, CCC-SLP [phone]   

## 2015-06-22 NOTE — Progress Notes (Signed)
CRITICAL VALUE ALERT  Critical value received:  Phos <1.0  Date of notification:  06/22/15  Time of notification:  0325  Critical value read back: y  Nurse who received alert:  Tollette  MD notified (1st page):  elink  Time of first page:  0330  MD notified (2nd page):  Time of second page:  Responding MD:  elink  Time MD responded:  0330

## 2015-06-22 NOTE — Evaluation (Signed)
Clinical/Bedside Swallow Evaluation Patient Details  Name: Katie Nichols MRN: [id number] Date of Birth: 09/21/1971  Today's Date: 06/22/2015 Time: SLP Start Time (ACUTE ONLY): 1000 SLP Stop Time (ACUTE ONLY): 1020 SLP Time Calculation (min) (ACUTE ONLY): 20 min  Past Medical History:  Past Medical History  Diagnosis Date  . Hypertension    Past Surgical History: No past surgical history on file. HPI:  Katie Nichols is a 44 y.o. female hx of HTN, asthma presenting with SOB and code stroke with acute onset of right sided weakness and sensory deficits. CT head imaging reviewed shows left basal ganglia ICH with minimal intra-ventricular extension with around 3mm midline shift. Intubated 9/26-9/28.    Assessment / Plan / Recommendation Clinical Impression  Bedside swallow evaluation complete. Patient presents with a suspected mild oropharyngeal dysphagia with CN VII and XII impairements on the right impacting oral function and suspected pharyngeal dysfunction resulting from brief intubation. Vocal quality hoarse indicative of decreased glottal closure. Overall, patient seemingly with good airway protection however risk of silent aspiration high. Recommend NPO pending instrumental testing to determine least restrictive diet. Plan for MBS at 1200.     Aspiration Risk  Moderate    Diet Recommendation NPO   Medication Administration: Via alternative means    Other  Recommendations Oral Care Recommendations: Oral care QID   Follow Up Recommendations       Frequency and Duration        Pertinent Vitals/Pain n/a     Swallow Study    General Other Pertinent Information: Katie Nichols is a 44 y.o. female hx of HTN, asthma presenting with SOB and code stroke with acute onset of right sided weakness and sensory deficits. CT head imaging reviewed shows left basal ganglia ICH with minimal intra-ventricular extension with around 3mm midline shift. Intubated 9/26-9/28.  Type of Study: Bedside  swallow evaluation Previous Swallow Assessment: none noted Diet Prior to this Study: NPO Temperature Spikes Noted: No Respiratory Status: Supplemental O2 delivered via (comment) (nasal cannula) History of Recent Intubation: Yes Length of Intubations (days): 2 days Date extubated: 06/21/15 Behavior/Cognition: Lethargic/Drowsy;Other (Comment) (flat affect) Oral Cavity - Dentition: Adequate natural dentition/normal for age Self-Feeding Abilities: Able to feed self Patient Positioning: Upright in bed Baseline Vocal Quality: Low vocal intensity;Hoarse Volitional Cough: Strong Volitional Swallow: Able to elicit    Oral/Motor/Sensory Function Overall Oral Motor/Sensory Function: Impaired Labial ROM: Reduced right Labial Symmetry: Abnormal symmetry right Labial Strength: Reduced Labial Sensation: Reduced Lingual ROM: Within Functional Limits Lingual Symmetry: Abnormal symmetry right Lingual Strength: Reduced Lingual Sensation: Within Functional Limits Facial ROM: Reduced right Facial Symmetry: Right droop Facial Strength: Reduced Facial Sensation: Reduced Velum: Within Functional Limits Mandible: Within Functional Limits   Ice Chips Ice chips: Impaired Presentation: Spoon Oral Phase Impairments: Reduced lingual movement/coordination Pharyngeal Phase Impairments: Cough - Delayed;Throat Clearing - Delayed   Thin Liquid Thin Liquid: Impaired Presentation: Cup;Self Fed Oral Phase Impairments: Reduced labial seal Oral Phase Functional Implications: Right anterior spillage;Left anterior spillage Pharyngeal  Phase Impairments: Suspected delayed Swallow;Multiple swallows    Nectar Thick Nectar Thick Liquid: Not tested   Honey Thick Honey Thick Liquid: Not tested   Puree Puree: Within functional limits Presentation: Spoon   Solid   GO    Solid: Impaired Presentation: Self Fed Oral Phase Impairments: Reduced lingual movement/coordination;Impaired anterior to posterior transit Oral  Phase Functional Implications: Other (comment) (delayed oral transit)      Katie McCoy MA, CCC-SLP [phone]  Nichols Katie Katie 06/22/2015,10:28 AM

## 2015-06-22 NOTE — Consult Note (Addendum)
Hospital Consult    Reason for Consult:  Right R renal artery stenosis Referring Physician:  Roda Shutters MRN #:  [id number]  History of Present Illness: This is a 44 y.o. female who was admitted 3 days ago after being found on the floor with right sided weakness and sensory deficits.  She was brought to the hospital as a code stroke.  She was hypertensive with BP of 280/180 and HR in 120's.  She did have a CT scan, which revealed left basal ganglia ICH with minimal intraventricular extension with around 3mm midline shift.  There were no signs of hydrocephalus.  She was intubated for airway protection.  She was extubated yesterday.  Her blood pressure has remained elevated despite gtts/medications.  She had a renal duplex, which revealed a right RAS of 60-99% stenosis.  [facility] is consulted.    She was not on any antihypertensives prior to admission.  She did take Goody's powder for HA.  She is on albuterol prn for wheezing/sob.  Her father has controlled HTN, DM, and CAD with hx of CABG x 6.  Her mother also has HTN and takes an ACEI.    Past Medical History  Diagnosis Date  . Hypertension     Past Surgical History: none  Allergies  Allergen Reactions  . Other     tomatoe    Prior to Admission medications   Medication Sig Start Date End Date Taking? Authorizing Provider  albuterol (PROVENTIL HFA;VENTOLIN HFA) 108 (90 BASE) MCG/ACT inhaler Inhale 2 puffs into the lungs every 6 (six) hours as needed for wheezing or shortness of breath.   Yes Historical Provider, MD  Aspirin-Acetaminophen-Caffeine (GOODY HEADACHE PO) Take 1 packet by mouth daily.   Yes Historical Provider, MD    Social History   Social History  . Marital Status: Divorced    Spouse Name: N/A  . Number of Children: N/A  . Years of Education: N/A   Occupational History  . Not on file.   Social History Main Topics  . Smoking status: Not on file  . Smokeless tobacco: Not on file  . Alcohol Use: Not on file  . Drug Use:  Not on file  . Sexual Activity: Not on file   Other Topics Concern  . Not on file   Social History Narrative   Family History  Problem Relation Age of Onset  . Heart failure Mother   . Diabetes Father   . Cancer Father   . Hypertension Father   . Coronary artery disease Father     hx CABG x 6    ROS: [x]  Positive   [ ]  Negative   [ ]  All sytems reviewed and are negative  Cardiovascular: []  chest pain/pressure []  palpitations []  SOB lying flat []  DOE []  pain in legs while walking []  pain in legs at rest []  pain in legs at night []  non-healing ulcers []  hx of DVT []  swelling in legs [x]  uncontrolled htn  Pulmonary: []  productive cough []  asthma/wheezing []  home O2  Neurologic: [x]  right sided weakness [x]  decreased sensation right side []  hx of CVA []  mini stroke [] difficulty speaking or slurred speech []  temporary loss of vision in one eye []  dizziness [x]  ICH   Hematologic: []  hx of cancer []  bleeding problems []  problems with blood clotting easily  Endocrine:   []  diabetes []  thyroid disease  GI []  vomiting blood []  blood in stool  GU: []  CKD/renal failure []  HD--[]  M/W/F or []  T/T/S []  burning  with urination  blood in urine  Psychiatric:  anxiety  depression  Musculoskeletal:  arthritis  joint pain  Integumentary:  rashes  ulcers  Constitutional:  fever  chills   Physical Examination  Filed Vitals:   06/22/15 1600  BP: 184/89  Pulse: 98  Temp: 99.1 F (37.3 C)  Resp: 28   Body mass index is 31.43 kg/(m^2).  General:  WDWN in NAD Gait: Not observed Head: [place]/AT ENT: hearing grossly intact, oropharynx and nasopharynx without drainage Neck: supple with rigidity Pulmonary: normal non-labored breathing, without Rales, rhonchi,  wheezing Cardiac: tachycardiac, regular, without  Murmurs, rubs or gallops;  Abdomen: obese, soft, no grimace to palpable, -G/R, no flank bruits Skin: without rashes, without  ulcers  Vascular Exam/Pulses:  Right Left  Radial 2+ (normal) 2+ (normal)  DP 2+ (normal) 2+ (normal)   Extremities: without ischemic changes, without Gangrene , without cellulitis; without open wounds;  Musculoskeletal: no muscle wasting or atrophy, BLE edema 1+  Neurologic: A&O; 0/5 RUE/RLE; 5/5 LUE/LLE; sensation in tact RLE but decreased; sensation intact LLE Psychiatric: confused, unaware of her current circumstances Lymph : No Cervical, Axillary, or Inguinal lymphadenopathy    CBC    Component Value Date/Time   WBC 9.2 06/22/2015 0235   RBC 4.87 06/22/2015 0235   HGB 14.2 06/22/2015 0235   HCT 43.1 06/22/2015 0235   PLT 191 06/22/2015 0235   MCV 88.5 06/22/2015 0235   MCH 29.2 06/22/2015 0235   MCHC 32.9 06/22/2015 0235   RDW 13.4 06/22/2015 0235   LYMPHSABS 1.9 06/19/2015 1918   MONOABS 0.3 06/19/2015 1918   EOSABS 0.3 06/19/2015 1918   BASOSABS 0.1 06/19/2015 1918    BMET    Component Value Date/Time   NA 141 06/22/2015 1443   K 2.8* 06/22/2015 0235   CL 110 06/22/2015 0235   CO2 26 06/22/2015 0235   GLUCOSE 148* 06/22/2015 0235   BUN 14 06/22/2015 0235   CREATININE 0.52 06/22/2015 0235   CALCIUM 9.3 06/22/2015 0235   GFRNONAA >60 06/22/2015 0235   GFRAA >60 06/22/2015 0235    COAGS: Lab Results  Component Value Date   INR 1.01 06/19/2015     Non-Invasive Vascular Imaging:   Renal duplex 06/22/15: Elevated velocities in the right renal artery are consistent with 60-99% stenosis. Left renal artery appeared within normal limits.   Statin:  Yes.   (started in hospital) Beta Blocker:  Yes.   (started in hospital) Aspirin:  No. ACEI:  No. ARB:  No. Other antiplatelets/anticoagulants:  No.   Radiology: Ct Head Wo Contrast  06/21/2015   CLINICAL DATA:  Drowsiness.  Intracranial hemorrhage.  EXAM: CT HEAD WITHOUT CONTRAST  TECHNIQUE: Contiguous axial images were obtained from the base of the skull through the vertex without intravenous contrast.   COMPARISON:  CT head 06/21/2015  FINDINGS: Left alignment hematoma unchanged in size measuring 26 x 23 mm. Intraventricular extension is present with blood in the ventricles primarily in the left lateral ventricle with some extension into the third ventricle. Mild edema surrounding the left thalamic hematoma stable. Local mass-effect unchanged. No new hemorrhage  Mild ventricular enlargement shows slight progression. Temporal horns and frontal horns appear slightly larger.  Calvarium intact.  Paranasal sinuses clear.  IMPRESSION: Left thalamic hematoma unchanged. Intraventricular hemorrhage unchanged.  Mild ventricular enlargement has progressed slightly from earlier today.   Electronically Signed   By: Marlan Palau M.D.   On: 06/21/2015 16:37   Ct Head Wo Contrast  06/21/2015  CLINICAL DATA:  Follow-up examination for intracranial hemorrhage.  EXAM: CT HEAD WITHOUT CONTRAST  TECHNIQUE: Contiguous axial images were obtained from the base of the skull through the vertex without intravenous contrast.  COMPARISON:  Prior study from 06/20/2015.  FINDINGS: Intraparenchymal hematoma centered at the left thalamus measures 2.4 x 2.5 cm, and not significantly changed relative to previous study. Extension into the adjacent white matter tracts again noted. Associated localized vasogenic edema about the hematoma is not significantly changed. Intraventricular extension with blood in the left lateral ventricle is walls the atrium of the right lateral ventricle again seen. Trace hemorrhage at the foramen of Monro. Trace hemorrhage also noted at the cerebral aqueduct. Overall, amount of intraventricular hemorrhage is slightly decreased. Stable dilatation of the temporal horns of the lateral ventricles. Stable 6 mm left-to-right shift at the level of the hematoma.  Previously seen hemorrhage within the inner right ocular cistern is less apparent, consistent with redistribution.  No other new intracranial process. Vascular  calcifications again noted within the carotid siphons and distal vertebral arteries.  IMPRESSION: 1. Stable size of left thalamic hematoma with similar localized vasogenic edema. 2. Overall slight interval decrease in intraventricular hemorrhage with stable mild dilatation of the temporal horns. 3. Stable 6 mm left-to-right shift at the level of the hematoma.   Electronically Signed   By: Rise Mu M.D.   On: 06/21/2015 06:11   Dg Chest Port 1 View  06/21/2015   CLINICAL DATA:  Hypertension.  Ventilator  EXAM: PORTABLE CHEST 1 VIEW  COMPARISON:  06/20/2015  FINDINGS: Support devices are stable. Heart is upper limits normal in size. Lungs are clear. No effusions or acute bony abnormality.  IMPRESSION: Stable support devices.  No acute findings.   Electronically Signed   By: Charlett Nose M.D.   On: 06/21/2015 07:40   Dg Swallowing Func-speech Pathology  06/22/2015    Objective Swallowing Evaluation:   MBS Patient Details  Name: LOVENE MARET MRN: [id number] Date of Birth: 01/03/1971  Today's Date: 06/22/2015 Time: SLP Start Time (ACUTE ONLY): 1208-SLP Stop Time (ACUTE ONLY): 1228 SLP Time Calculation (min) (ACUTE ONLY): 20 min  Past Medical History:  Past Medical History  Diagnosis Date  . Hypertension    Past Surgical History: No past surgical history on file. HPI:  Other Pertinent Information: JISSEL SLAVENS is a 44 y.o. female hx of HTN,  asthma presenting with SOB and code stroke with acute onset of right sided  weakness and sensory deficits. CT head imaging reviewed shows left basal  ganglia ICH with minimal intra-ventricular extension with around 3mm  midline shift. Intubated 9/26-9/28.   No Data Recorded  Assessment / Plan / Recommendation CHL IP CLINICAL IMPRESSIONS 06/22/2015  Therapy Diagnosis Mild oral phase dysphagia;Mild pharyngeal phase  dysphagia  Clinical Impression Patient presents with a mild sensory motor based  oropharyngeal dysphagia with origins in CVA and 48 hour intubation.   Additionally, lethargy playing a role in increased aspiration risk.  Orally, right sided labial weakness results in intermittent anterior  labial spillage of bolus and mildly delayed oral transit of solids.  Delayed swallow initiation results in swallow trigger at the level of the  pyriform sinuses, leading to trace penetration of nectar thick and silent  aspiration of thin liquids. Cues for more head neutral, chin tucked  position ineffective to prevent.  Thickened liquids are currently safer  for po consumption based on above deficits. Prognosis for improvement  good, particularly with improved levels of alertness.  CHL IP TREATMENT RECOMMENDATION 06/22/2015  Treatment Recommendations Therapy as outlined in treatment plan below     CHL IP DIET RECOMMENDATION 06/22/2015  SLP Diet Recommendations Dysphagia 3 (Mech soft);Nectar  Liquid Administration via (None)  Medication Administration Whole meds with puree  Compensations Slow rate;Small sips/bites;Check for pocketing  Postural Changes and/or Swallow Maneuvers (None)     CHL IP OTHER RECOMMENDATIONS 06/22/2015  Recommended Consults (None)  Oral Care Recommendations Oral care BID  Other Recommendations Order thickener from pharmacy;Prohibited food  (jello, ice cream, thin soups);Remove water pitcher     CHL IP FOLLOW UP RECOMMENDATIONS 06/22/2015  Follow up Recommendations Inpatient Rehab     CHL IP FREQUENCY AND DURATION 06/22/2015  Speech Therapy Frequency (ACUTE ONLY) min 2x/week  Treatment Duration 2 weeks         CHL IP REASON FOR REFERRAL 06/22/2015  Reason for Referral Objectively evaluate swallowing function            Ferdinand Lango MA, CCC-SLP [phone]         Ferdinand Lango Meryl 06/22/2015, 1:26 PM     ASSESSMENT/PLAN: This is a 44 y.o. female with recent ICH with residual neurologic deficits, recalcitrant malignant HTN, hypokalemia, and right RAS   -pt may possibly need renal artery stenting, which would require heparin at time of procedure.  However,  given that she has had an intracranial hemorrhage, this may not be an option. -Dr. Imogene Burn will be in to see with a further plan and recommendations.   Doreatha Massed, PA-C [facility] [phone]  Addendum  I have independently interviewed and examined the patient, and I agree with the physician assistant's findings.  Pt's history was unreliable as she was confused.  She noted prior history of HTN and intermittently taking medications, but this is no consistent with other history.  With the severe HTN upon presentation, consideration of other etiologies including pheochromocytoma and Conn's Syndrome need to be kept in the differential diagnosis.    I reviewed this patient renal artery duplex and the velocities (RAR > 3.5, EDV >150 c/s) are consistent with a R RA stenosis >80%.  Given the difficulty of a renal artery duplex, confirmation and treatment with a Right renal angiogram with possible stenting is recommended.  - Continue work-up including drawing labs for:  1.  Conn's Syndrome (recalcitrant hypertension and hypokalemia): plasma renin activity, plasma aldosterone concentration  2.  Pheochromocytoma: 24-hr fractionated catecholamines and metanephrines, plasma fractionated metanephrines   3. R Renal artery stenosis >80%: will schedule R renal angiogram with possible stenting on Monday if medically stable over the weekend  - I have ordered these labs to investigate these possibilities  - Hemorrhagic stroke mgmt per Neurology  - Dr. Darrick Penna will be covering for the Vascular Surgery service over the weekend.   Leonides Sake, MD [facility] [place] Office: [phone] Pager: [phone]  06/22/2015, 7:36 PM

## 2015-06-22 NOTE — Progress Notes (Signed)
PT Cancellation Note  Patient Details Name: Katie Nichols MRN: [id number] DOB: [birth year]   Cancelled Treatment:    Reason Eval/Treat Not Completed: Patient not medically ready.  Pt continues to be appropriately on bedrest.  Will await activity orders. 06/22/2015  [place] Bing, PT [phone]  (pager)   [place]tinger, Eliseo Gum 06/22/2015, 12:42 PM

## 2015-06-22 NOTE — Progress Notes (Signed)
eLink Physician-Brief Progress Note Patient Name: Katie Nichols DOB: 10-14-1970 MRN: [id number]   Date of Service  06/22/2015  HPI/Events of Note  Continued HTN on drip on max dose, did not respond to cardene.   eICU Interventions  Switch to esmolol drip, watch for signs of wheezing, if present, may need to switch to labetalol.      Intervention Category Major Interventions: Hypertension - evaluation and management  Shane Crutch 06/22/2015, 4:58 AM

## 2015-06-22 NOTE — Progress Notes (Signed)
PULMONARY / CRITICAL CARE MEDICINE   Name: Katie Nichols MRN: [id number] DOB: Feb 12, 1971    ADMISSION DATE:  06/19/2015 CONSULTATION DATE:  06/19/15  REFERRING MD :  Roda Shutters  CHIEF COMPLAINT:  weakness  INITIAL PRESENTATION:  44 y.o. F brought to [facility] ED 9/26 as code stroke.  She was found to have left basal ganglia ICH and was intubated for airway protection.  PCCM called for vent management and BP control.    STUDIES:  CT head 9/26 >>> acute left basal ganglia hemorrhage with intraventricular breakthrough.  No early hydrocephalus.  3mm L to R MLS.  SIGNIFICANT EVENTS: 9/26 - admit with left ICH.  HISTORY OF PRESENT ILLNESS:  Pt is encephalopathic; therefore, this HPI is obtained from chart review. Katie Nichols is a 44 y.o. F with PMH of HTN (not on medications) who was brought to [facility] ED 9/26 as code stroke.  She had acute onset of right sided weakness and sensory deficits.  She was last seen normal earlier that evening (around 1640); however, she had SOB at that time for which EMS was dispatched.  On EMS arrival, pt was found on the floor with right sided weakness.  SBP on scene was 280/180.  In ED, CT of the head shows left basal ganglia ICH with minimal intraventricular extension with around 3mm midline shift.  No signs of hydrocephalus.  She was intubated for airway protection and PCCM was consulted for vent management.  SUBJECTIVE: No events overnight, awake and interactive.  VITAL SIGNS: Temp:  [97.7 F (36.5 C)-100 F (37.8 C)] 99.3 F (37.4 C) (09/29 1130) Pulse Rate:  [75-113] 82 (09/29 1130) Resp:  [10-24] 24 (09/29 1130) BP: (153-205)/(77-125) 158/90 mmHg (09/29 1130) SpO2:  [90 %-100 %] 96 % (09/29 1130) FiO2 (%):  [40 %] 40 % (09/28 1200) Weight:  [88.3 kg (194 lb 10.7 oz)] 88.3 kg (194 lb 10.7 oz) (09/29 0500) HEMODYNAMICS:   VENTILATOR SETTINGS: Vent Mode:  [-] PSV;CPAP FiO2 (%):  [40 %] 40 % PEEP:  [5 cmH20] 5 cmH20 Pressure Support:  [10 cmH20] 10 cmH20 INTAKE /  OUTPUT: Intake/Output      09/28 0701 - 09/29 0700 09/29 0701 - 09/30 0700   I.V. (mL/kg) 1961.2 (22.2) 1108.1 (12.5)   NG/GT 80    IV Piggyback 585    Total Intake(mL/kg) 2626.2 (29.7) 1108.1 (12.5)   Urine (mL/kg/hr) 5045 (2.4) 1000 (2.4)   Emesis/NG output     Total Output 5045 1000   Net -2418.8 +108.1         PHYSICAL EXAMINATION: General: Adult female, in NAD. Neuro: Alert and interactive, follows commands on the left only HEENT: [place]/AT. PERRL, sclerae anicteric. Cardiovascular: RRR, no M/R/G.  Lungs: Respirations even and unlabored.  Faint expiratory wheeze. Abdomen: BS x 4, soft, NT/ND.  Musculoskeletal: No gross deformities, no edema.  Skin: Intact, warm, no rashes.  LABS:  CBC  Recent Labs Lab 06/20/15 0400 06/21/15 0320 06/22/15 0235  WBC 6.6 5.2 9.2  HGB 13.7 11.6* 14.2  HCT 41.1 36.3 43.1  PLT 190 142* 191   Coag's  Recent Labs Lab 06/19/15 1918  APTT 31  INR 1.01   BMET  Recent Labs Lab 06/21/15 0320  06/21/15 1604 06/21/15 2115 06/22/15 0235 06/22/15 0828  NA 144  < > 146* 145 147* 148*  K 3.7  --  2.8*  --  2.8*  --   CL 115*  --  110  --  110  --  CO2 23  --  29  --  26  --   BUN 21*  --  12  --  14  --   CREATININE 0.54  --  0.44  --  0.52  --   GLUCOSE 121*  --  151*  --  148*  --   < > = values in this interval not displayed. Electrolytes  Recent Labs Lab 06/20/15 0400 06/21/15 0320 06/21/15 1604 06/22/15 0235  CALCIUM 8.2* 8.3* 9.0 9.3  MG 1.9 2.0  --  2.1  PHOS 4.6 2.1*  --  <1.0*   Sepsis Markers No results for input(s): LATICACIDVEN, PROCALCITON, O2SATVEN in the last 168 hours. ABG  Recent Labs Lab 06/19/15 2152  PHART 7.253*  PCO2ART 47.1*  PO2ART 564*   Liver Enzymes  Recent Labs Lab 06/19/15 1918 06/21/15 1604  AST 19 16  ALT 13* 15  ALKPHOS 56 50  BILITOT 0.6 0.4  ALBUMIN 4.2 3.5   Cardiac Enzymes No results for input(s): TROPONINI, PROBNP in the last 168 hours. Glucose  Recent Labs Lab  06/21/15 0724 06/21/15 1132 06/21/15 1507 06/21/15 1806 06/21/15 2119 06/22/15 0020  GLUCAP 130* 125* 150* 148* 130* 137*    Imaging Ct Head Wo Contrast  06/21/2015   CLINICAL DATA:  Drowsiness.  Intracranial hemorrhage.  EXAM: CT HEAD WITHOUT CONTRAST  TECHNIQUE: Contiguous axial images were obtained from the base of the skull through the vertex without intravenous contrast.  COMPARISON:  CT head 06/21/2015  FINDINGS: Left alignment hematoma unchanged in size measuring 26 x 23 mm. Intraventricular extension is present with blood in the ventricles primarily in the left lateral ventricle with some extension into the third ventricle. Mild edema surrounding the left thalamic hematoma stable. Local mass-effect unchanged. No new hemorrhage  Mild ventricular enlargement shows slight progression. Temporal horns and frontal horns appear slightly larger.  Calvarium intact.  Paranasal sinuses clear.  IMPRESSION: Left thalamic hematoma unchanged. Intraventricular hemorrhage unchanged.  Mild ventricular enlargement has progressed slightly from earlier today.   Electronically Signed   By: Marlan Palau M.D.   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 P:   D/C all sedation. Daily WUA. Neuro following. Repeat CT head noted.  PULMONARY OETT 9/26 >>>9/28 A: VDRF due to inability to protect airway in the setting of acute ICH -- resolved. P:   Titrate O2 for sat of 88-92% VAP prevention measures. Albuterol PRN wheezing. Swallow evaluation today.  CARDIOVASCULAR A:  Hypertensive emergency with acute ICH. P:  Cleviprex gtt for goal SBP < 160, currently on 15 and esmolol at 75 mcg drips. Once able to take PO will start PO hydralazine (renal artery stenosis history causing hypertension). Labetalol PRN. Will need outpatient follow up as well as outpatient antihypertensive regimen.  RENAL A:    Hypokalemia. P:   NS @ 50 will KVO once able to take PO. Replace electrolytes as indicated. BMP in AM.  GASTROINTESTINAL A:   GI prophylaxis. Nutrition. P:   SUP: Pantoprazole. Modified barium swallow today  HEMATOLOGIC A:   VTE Prophylaxis. P:  SCD's only. CBC in AM.  INFECTIOUS A:   No indication of infection. P:   Monitor clinically.  ENDOCRINE A:   Hyperglycemia - no hx DM.  P:   SSI. Check Hgb A1c.  Family updated: No family bedside 9/29.  Interdisciplinary Family Meeting v Palliative Care Meeting:  Due by:  10/2.  The patient is critically ill with multiple organ systems failure and requires high complexity decision making for assessment and support, frequent evaluation and titration of therapies, application of advanced monitoring technologies and extensive interpretation of multiple databases.   Critical Care Time devoted to patient care services described in this note is  35  Minutes. This time reflects time of care of this signee Dr Koren Bound. This critical care time does not reflect procedure time, or teaching time or supervisory time of PA/NP/Med student/Med Resident etc but could involve care discussion time.  Alyson Reedy, M.D. [facility] Pulmonary/Critical Care Medicine. Pager: [phone]. After hours pager: [phone].

## 2015-06-22 NOTE — Progress Notes (Signed)
eLink Physician-Brief Progress Note Patient Name: EMRY TOBIN DOB: Mar 13, 1971 MRN: [id number]   Date of Service  06/22/2015  HPI/Events of Note  Low K, Phos  eICU Interventions  Ordered replacement.      Intervention Category Intermediate Interventions: Electrolyte abnormality - evaluation and management  Shane Crutch 06/22/2015, 3:55 AM

## 2015-06-23 ENCOUNTER — Inpatient Hospital Stay (HOSPITAL_COMMUNITY): Payer: Medicaid Other

## 2015-06-23 DIAGNOSIS — I701 Atherosclerosis of renal artery: Secondary | ICD-10-CM

## 2015-06-23 LAB — BASIC METABOLIC PANEL
Anion gap: 9 (ref 5–15)
Anion gap: 8 (ref 5–15)
BUN: 9 mg/dL (ref 6–20)
BUN: 10 mg/dL (ref 6–20)
CALCIUM: 8.9 mg/dL (ref 8.9–10.3)
CO2: 29 mmol/L (ref 22–32)
CO2: 26 mmol/L (ref 22–32)
CREATININE: 0.49 mg/dL (ref 0.44–1.00)
Calcium: 8.8 mg/dL — ABNORMAL LOW (ref 8.9–10.3)
Chloride: 112 mmol/L — ABNORMAL HIGH (ref 101–111)
Chloride: 113 mmol/L — ABNORMAL HIGH (ref 101–111)
Creatinine, Ser: 0.45 mg/dL (ref 0.44–1.00)
GFR calc Af Amer: >60 mL/min (ref >60)
GFR calc Af Amer: >60 mL/min (ref >60)
GFR calc non Af Amer: >60 mL/min (ref >60)
GLUCOSE: 134 mg/dL — AB (ref 65–99)
GLUCOSE: 133 mg/dL — AB (ref 65–99)
POTASSIUM: 2.8 mmol/L — AB (ref 3.5–5.1)
Potassium: 2.4 mmol/L — CL (ref 3.5–5.1)
Sodium: 150 mmol/L — ABNORMAL HIGH (ref 135–145)
Sodium: 147 mmol/L — ABNORMAL HIGH (ref 135–145)

## 2015-06-23 LAB — CBC
HEMATOCRIT: 44 % (ref 36.0–46.0)
Hemoglobin: 14.6 g/dL (ref 12.0–15.0)
MCH: 29.1 pg (ref 26.0–34.0)
MCHC: 33.2 g/dL (ref 30.0–36.0)
MCV: 87.6 fL (ref 78.0–100.0)
Platelets: 222 10*3/uL (ref 150–400)
RBC: 5.02 MIL/uL (ref 3.87–5.11)
RDW: 13.9 % (ref 11.5–15.5)
WBC: 8.8 10*3/uL (ref 4.0–10.5)

## 2015-06-23 LAB — GLUCOSE, CAPILLARY
GLUCOSE-CAPILLARY: 126 mg/dL — AB (ref 65–99)
GLUCOSE-CAPILLARY: 132 mg/dL — AB (ref 65–99)
GLUCOSE-CAPILLARY: 149 mg/dL — AB (ref 65–99)
GLUCOSE-CAPILLARY: 104 mg/dL — AB (ref 65–99)
Glucose-Capillary: 125 mg/dL — ABNORMAL HIGH (ref 65–99)

## 2015-06-23 LAB — CATECHOLAMINES, FRACTIONATED, PLASMA
EPINEPHRINE: 82 pg/mL — AB (ref 0–62)
Norepinephrine: 309 pg/mL (ref 0–874)

## 2015-06-23 LAB — SODIUM
Sodium: 153 mmol/L — ABNORMAL HIGH (ref 135–145)
Sodium: 148 mmol/L — ABNORMAL HIGH (ref 135–145)

## 2015-06-23 LAB — MAGNESIUM: Magnesium: 1.8 mg/dL (ref 1.7–2.4)

## 2015-06-23 LAB — PHOSPHORUS: Phosphorus: 1.7 mg/dL — ABNORMAL LOW (ref 2.5–4.6)

## 2015-06-23 MED ORDER — HYDRALAZINE HCL 50 MG PO TABS
100.0000 mg | ORAL_TABLET | Freq: Three times a day (TID) | ORAL | Status: DC
  Administered 2015-06-23 – 2015-06-28 (×15): 100 mg via ORAL
  Filled 2015-06-23 (×15): qty 2

## 2015-06-23 MED ORDER — POTASSIUM CHLORIDE CRYS ER 20 MEQ PO TBCR
40.0000 meq | EXTENDED_RELEASE_TABLET | Freq: Once | ORAL | Status: AC
  Administered 2015-06-23: 40 meq via ORAL
  Filled 2015-06-23: qty 2

## 2015-06-23 MED ORDER — METOPROLOL TARTRATE 50 MG PO TABS
50.0000 mg | ORAL_TABLET | Freq: Two times a day (BID) | ORAL | Status: DC
  Administered 2015-06-23 – 2015-06-24 (×3): 50 mg via ORAL
  Filled 2015-06-23 (×3): qty 1

## 2015-06-23 MED ORDER — POTASSIUM CHLORIDE CRYS ER 20 MEQ PO TBCR
40.0000 meq | EXTENDED_RELEASE_TABLET | ORAL | Status: AC
  Administered 2015-06-23 (×3): 40 meq via ORAL
  Filled 2015-06-23 (×3): qty 2

## 2015-06-23 MED ORDER — CLONIDINE HCL 0.1 MG PO TABS
0.1000 mg | ORAL_TABLET | Freq: Three times a day (TID) | ORAL | Status: DC
  Administered 2015-06-23 (×3): 0.1 mg via ORAL
  Filled 2015-06-23 (×3): qty 1

## 2015-06-23 MED ORDER — HEPARIN SODIUM (PORCINE) 5000 UNIT/ML IJ SOLN
5000.0000 [IU] | Freq: Three times a day (TID) | INTRAMUSCULAR | Status: DC
  Administered 2015-06-23 – 2015-06-29 (×19): 5000 [IU] via SUBCUTANEOUS
  Filled 2015-06-23 (×17): qty 1

## 2015-06-23 MED ORDER — SPIRONOLACTONE 25 MG PO TABS
25.0000 mg | ORAL_TABLET | Freq: Every day | ORAL | Status: DC
  Administered 2015-06-23 – 2015-06-24 (×2): 25 mg via ORAL
  Filled 2015-06-23 (×2): qty 1

## 2015-06-23 MED ORDER — PANTOPRAZOLE SODIUM 40 MG PO TBEC
40.0000 mg | DELAYED_RELEASE_TABLET | Freq: Every day | ORAL | Status: DC
  Administered 2015-06-23 – 2015-06-29 (×7): 40 mg via ORAL
  Filled 2015-06-23 (×7): qty 1

## 2015-06-23 MED ORDER — POTASSIUM CHLORIDE 10 MEQ/50ML IV SOLN
10.0000 meq | INTRAVENOUS | Status: AC
  Administered 2015-06-23 – 2015-06-24 (×6): 10 meq via INTRAVENOUS
  Filled 2015-06-23 (×6): qty 50

## 2015-06-23 NOTE — Progress Notes (Signed)
[place] ICU Electrolyte Replacement Protocol  Patient Name: ALBERTO SCHOCH DOB: 08-Jun-1971 MRN: [id number]  Date of Service  06/23/2015   HPI/Events of Note    Recent Labs Lab 06/20/15 0400  06/21/15 0320  06/21/15 1604  06/22/15 0235 06/22/15 0828 06/22/15 1443 06/22/15 2059 06/23/15 0600  NA 136  < > 144  < > 146*  < > 147* 148* 141 148* 150*  K 4.1  --  3.7  --  2.8*  --  2.8*  --   --   --  2.4*  CL 105  --  115*  --  110  --  110  --   --   --  112*  CO2 23  --  23  --  29  --  26  --   --   --  29  GLUCOSE 74  --  121*  --  151*  --  148*  --   --   --  134*  BUN 21*  --  21*  --  12  --  14  --   --   --  9  CREATININE 1.03*  --  0.54  --  0.44  --  0.52  --   --   --  0.49  CALCIUM 8.2*  --  8.3*  --  9.0  --  9.3  --   --   --  8.9  MG 1.9  --  2.0  --   --   --  2.1  --   --   --  1.8  PHOS 4.6  --  2.1*  --   --   --  <1.0*  --   --   --  1.7*  < > = values in this interval not displayed.  Estimated Creatinine Clearance: 98.9 mL/min (by C-G formula based on Cr of 0.49).  Intake/Output      09/29 0701 - 09/30 0700 09/30 0701 - 10/01 0700   P.O. 1020    I.V. (mL/kg) 3057.5 (34.2)    NG/GT     IV Piggyback     Total Intake(mL/kg) 4077.5 (45.6)    Urine (mL/kg/hr) 3315 (1.5)    Total Output 3315     Net +762.5           - I/O DETAILED x24h      - I/O THIS SHIFT    ASSESSMENT   eICURN Interventions  Electrolyte protocol criteria met. Irregular labs replaced per protocol. MD notified.   ASSESSMENT: MAJOR ELECTROLYTE    Lorene Dy 06/23/2015, 7:13 AM

## 2015-06-23 NOTE — Progress Notes (Signed)
PT Cancellation Note  Patient Details Name: MEGHAM DWYER MRN: [id number] DOB: 03-26-1971   Cancelled Treatment:    Reason Eval/Treat Not Completed: Patient not medically ready.  Discussed with nsg. Having difficulty sustaining BP below 160. Will reassess tomorrow as able. 06/23/2015  London Bing, PT [phone]  (pager)   Mottinger, Eliseo Gum 06/23/2015, 1:07 PM

## 2015-06-23 NOTE — Progress Notes (Signed)
OT Cancellation Note  Patient Details Name: Katie Nichols MRN: [id number] DOB: 1971/07/31   Cancelled Treatment:    Reason Eval/Treat Not Completed: Patient not medically ready Discussed with nsg. Having difficulty sustaining BP below 160. Will reassess tomorrow.  [facility], OTR/L  [phone] 06/23/2015 06/23/2015, 11:14 AM

## 2015-06-23 NOTE — Progress Notes (Signed)
STROKE TEAM PROGRESS NOTE   SUBJECTIVE (INTERVAL HISTORY) No family is at the bedside. Neuro stable overnight. Repeat CT no change. Will taper off 3% saline. However, her BP still not in good control. maxed out on cleviprex and also on esmolol now. Started po meds but not very effective. Renal US showed right RAS. VVS on board.    OBJECTIVE Temp:  [99.1 F (37.3 C)-100.4 F (38 C)] 99.7 F (37.6 C) (09/30 1015) Pulse Rate:  [82-119] 102 (09/30 1015) Cardiac Rhythm:  [-] Sinus tachycardia (09/30 0800) Resp:  [14-42] 18 (09/30 0615) BP: (129-194)/(69-111) 151/96 mmHg (09/30 1015) SpO2:  [91 %-98 %] 95 % (09/30 1015) Weight:  [197 lb 1.5 oz (89.4 kg)] 197 lb 1.5 oz (89.4 kg) (09/30 0500)  CBC:  Recent Labs Lab 06/19/15 1918  06/22/15 0235 06/23/15 0600  WBC 6.8  < > 9.2 8.8  NEUTROABS 4.3  --   --   --   HGB 14.7  < > 14.2 14.6  HCT 43.1  < > 43.1 44.0  MCV 86.2  < > 88.5 87.6  PLT 199  < > 191 222  < > = values in this interval not displayed.  Basic Metabolic Panel:  Recent Labs Lab 06/22/15 0235  06/23/15 0600 06/23/15 0904  NA 147*  < > 150* 153*  K 2.8*  --  2.4*  --   CL 110  --  112*  --   CO2 26  --  29  --   GLUCOSE 148*  --  134*  --   BUN 14  --  9  --   CREATININE 0.52  --  0.49  --   CALCIUM 9.3  --  8.9  --   MG 2.1  --  1.8  --   PHOS <1.0*  --  1.7*  --   < > = values in this interval not displayed.  Lipid Panel:     Component Value Date/Time   CHOL 176 06/20/2015 2120   TRIG 69 06/20/2015 2120   HDL 53 06/20/2015 2120   CHOLHDL 3.3 06/20/2015 2120   VLDL 14 06/20/2015 2120   LDLCALC 109* 06/20/2015 2120   HgbA1c:  Lab Results  Component Value Date   HGBA1C 5.6 06/19/2015   Urine Drug Screen:     Component Value Date/Time   LABOPIA NONE DETECTED 06/21/2015 1233   COCAINSCRNUR NONE DETECTED 06/21/2015 1233   LABBENZ NONE DETECTED 06/21/2015 1233   AMPHETMU NONE DETECTED 06/21/2015 1233   THCU POSITIVE* 06/21/2015 1233   LABBARB NONE  DETECTED 06/21/2015 1233      IMAGING I have personally reviewed the radiological images below and agree with the radiology interpretations. Blue text is my interpretation.  Ct Head Wo Contrast  06/23/2015   IMPRESSION: Evolving LEFT thalamus intraparenchymal hematoma, local mass-effect.  Similar intraventricular blood products, mild hydrocephalus. Mild global parenchymal edema.    06/21/2015   IMPRESSION: 1. Stable size of left thalamic hematoma with similar localized vasogenic edema. 2. Overall slight interval decrease in intraventricular hemorrhage with stable mild dilatation of the temporal horns. 3. Stable 6 mm left-to-right shift at the level of the hematoma.   06/20/2015    1. Unchanged left thalamic hematoma.  2. Newly seen right lateral ventricle and interpeduncular fossa hemorrhage is likely redistribution. Overall, intraventricular hematoma volume is similar to yesterday. However, more prominent midline shift comparing with last CT head. 3. Stable mild dilation of the temporal horns.     06/19/2015  Acute LEFT basal ganglia hemorrhage with intraventricular breakthrough. No early hydrocephalus. 3 mm of LEFT-to- RIGHT midline shift.    Dg Chest Port 1 View 06/19/2015    1. Endotracheal tube tip 26 mm from the carina, in good position.  2. Enteric tube tip not visible.  3. No focal consolidation. Suggestion of pulmonary vascular congestion.     2D ehco - - Left ventricle: The cavity size was normal. Wall thickness was normal. Systolic function was normal. The estimated ejection fraction was in the range of 60% to 65%. Wall motion was normal; there were no regional wall motion abnormalities. Doppler parameters are consistent with abnormal left ventricular relaxation (grade 1 diastolic dysfunction). - Mitral valve: Transvalvular velocity was within the normal range. There was no evidence for stenosis. There was no regurgitation. - Right ventricle: The cavity size  was normal. Wall thickness was normal. Systolic function was normal. - Tricuspid valve: There was trivial regurgitation. - Pulmonary arteries: PA peak pressure: 40 mm Hg (S). - Inferior vena cava: The vessel was normal in size. The respirophasic diameter changes were blunted (< 50%), consistent with normal central venous pressure.  Renal artery [place] - Elevated velocities in the right renal artery are consistent with 60-99% stenosis. Left renal artery appeared within normal limits. Results given to patient's nurse.    PHYSICAL EXAM  Temp:  [99.1 F (37.3 C)-100.4 F (38 C)] 99.7 F (37.6 C) (09/30 1015) Pulse Rate:  [82-119] 102 (09/30 1015) Resp:  [14-42] 18 (09/30 0615) BP: (129-194)/(69-111) 151/96 mmHg (09/30 1015) SpO2:  [91 %-98 %] 95 % (09/30 1015) Weight:  [197 lb 1.5 oz (89.4 kg)] 197 lb 1.5 oz (89.4 kg) (09/30 0500)  General - obese, well developed, awake alert.  Ophthalmologic - Fundi not visualized due to noncooperation.  Cardiovascular - Regular rate and rhythm with no murmur.  Neuro - awake alert, able to follow all simple commands, with transcortical motor aphasia. PERRL, EOMI, blinking to visual threat on the both sides, right facial droop, RUE and RLE 0/5 even on pain stimulation, right side decreased sensation with pain stimulation, LUE and LLE spontaneous movement against gravity, babinski mute b/l, DTR 1+. No ataxia on the LUE.    ASSESSMENT/PLAN Ms. [name] is a 44 y.o. female with history of hypertension and asthma presenting with right-sided weakness and sensory deficits with blood pressure  280/180. She did not receive IV t-PA due to acute left basal ganglia hemorrhage.   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   Resultant  Right hemiplegia, transcortical motor aphasia.  MRI / MRA not ordered yet   Repeat CT head showed stable hematoma  and no hydrocephalus  UDS - positive for THC  Renal artery US showed right RAS  2D Echo unremarkable  LDL 109  HgbA1c 5.6  VTE prophylaxis subq heparin  DIET DYS 3 Room service appropriate?: Yes; Fluid consistency:: Nectar Thick  no antithrombotic prior to admission, now on no antithrombotic secondary to hemorrhage  Ongoing aggressive stroke risk factor management  Therapy recommendations: Pending  Disposition: Pending  Cerebral edema  Although not large volume ICH, however, medial thalamic ICH causing significant midline shift and uncal herniation  Repeat CT stable  taper off 3% saline  Na 136 -> 144 -> 147 -> 148 -> 150  Hypertension, malignant  On cleviprex and esmolol drip  BP goal < 160 On po meds with amlodipine, metoprolol,  hydralazine, clonidine and spiranolactone Renal artery ultrasound showed right RAS Check renin, ADH and cathecholamines - pending  RAS  Ultrasound showed right RAS  VVS on board  Consider angiogram on Monday  Hyperlipidemia   Not on any home meds  LDL 109, goal < 70  Add lipitor    Continue statin on discharge.  Other Stroke Risk Factors  Obesity, Body mass index is 31.83 kg/(m^2).   Other Active Problems  Hypokalemia - on supplement  Hospital day # 4  This patient is critically ill due to left thalamic ICH with ventricular extension, uncontrolled BP, right RAS and at significant risk of neurological worsening, death form enlargement of hematoma, obstructive hydrocephalus, HTN emergency, heart failure, CHF, cerebral edma and brain herniation. This patient's care requires constant monitoring of vital signs, hemodynamics, respiratory and cardiac monitoring, review of multiple databases, neurological assessment, discussion with family, other specialists and medical decision making of high complexity. I have discussed with Dr. Imogene Burn and Dr. Kendrick Fries. I spent 40 minutes of neurocritical care time in the care of this  patient.   Marvel Plan, MD PhD Stroke Neurology 06/23/2015 11:00 AM   To contact Stroke Continuity provider, please refer to [email or web address]. After hours, contact General Neurology

## 2015-06-23 NOTE — Progress Notes (Signed)
Speech Language Pathology Treatment: Dysphagia;Cognitive-Linquistic  Patient Details Name: Katie Nichols MRN: [id number] DOB: 1971/04/03 Today's Date: 06/23/2015 Time: 7829-5621 SLP Time Calculation (min) (ACUTE ONLY): 22 min  Assessment / Plan / Recommendation Clinical Impression  Skilled treatment session focused on addressing dysphagia and aphasia goals.  SLP provided set-up assist and Mod verbal cues for small portions and use of lingual sweep to manage right buccal pocketing.  Patient demonstrated no overt s/s of aspiration with nectar-thick liquids via cup and effective mastication and oral clearance of Dys. 3 textures with cues.  Recommend to continue with current diet orders and full supervision.    Patient able to express all basic needs and wants and follow directions during abovementioned task with increased time.  While ordering lunch patient was provided with choice of three cues.  SLP also facilitated session with a structured generative naming task, which patient required Mod semantic and phonemic cues to complete.  RN reports patient is having no difficulty communicating wants and needs.  Given level of independence prior to admission and high-level deficits intense therapy follow up is recommended at this time.      HPI Other Pertinent Information: Katie Nichols is a 44 y.o. female hx of HTN, asthma presenting with SOB and code stroke with acute onset of right sided weakness and sensory deficits. CT head imaging reviewed shows left basal ganglia ICH with minimal intra-ventricular extension with around 3mm midline shift. Intubated 9/26-9/28.    Pertinent Vitals Pain Assessment: No/denies pain  SLP Plan  Continue with current plan of care    Recommendations Diet recommendations: Dysphagia 3 (mechanical soft);Nectar-thick liquid Liquids provided via: Cup;No straw Medication Administration: Whole meds with puree Supervision: Patient able to self feed;Full supervision/cueing for  compensatory strategies Compensations: Slow rate;Small sips/bites;Check for pocketing Postural Changes and/or Swallow Maneuvers: Seated upright 90 degrees              General recommendations: Rehab consult Oral Care Recommendations: Oral care BID Follow up Recommendations: Inpatient Rehab Plan: Continue with current plan of care    GO    Charlane Ferretti., CCC-SLP [phone]  [name] 06/23/2015, 11:20 AM

## 2015-06-23 NOTE — Progress Notes (Signed)
PULMONARY / CRITICAL CARE MEDICINE   Name: Katie Nichols MRN: [id number] DOB: [birth year]    ADMISSION DATE:  06/19/2015 CONSULTATION DATE:  06/19/15  REFERRING MD :  Roda Shutters  CHIEF COMPLAINT:  weakness  INITIAL PRESENTATION:  44 y.o. F brought to [facility] ED 9/26 as code stroke.  She was found to have left basal ganglia ICH and was intubated for airway protection.  PCCM called for vent management and BP control.    STUDIES:  CT head 9/26 >>> acute left basal ganglia hemorrhage with intraventricular breakthrough.  No early hydrocephalus.  3mm L to R MLS.  SIGNIFICANT EVENTS: 9/26 - admit with left ICH. 9/28 Extubated   SUBJECTIVE: Awake and interactive but blood pressure still difficult to control  VITAL SIGNS: Temp:  [98.8 F (37.1 C)-100.4 F (38 C)] 99.5 F (37.5 C) (09/30 0800) Pulse Rate:  [75-119] 119 (09/30 0800) Resp:  [10-42] 18 (09/30 0615) BP: (129-194)/(69-111) 161/99 mmHg (09/30 0800) SpO2:  [91 %-100 %] 94 % (09/30 0800) Weight:  [197 lb 1.5 oz (89.4 kg)] 197 lb 1.5 oz (89.4 kg) (09/30 0500) HEMODYNAMICS:   VENTILATOR SETTINGS:   INTAKE / OUTPUT: Intake/Output      09/29 0701 - 09/30 0700 09/30 0701 - 10/01 0700   P.O. 1020    I.V. (mL/kg) 3164.5 (35.4) 107 (1.2)   NG/GT     IV Piggyback     Total Intake(mL/kg) 4184.5 (46.8) 107 (1.2)   Urine (mL/kg/hr) 3315 (1.5)    Total Output 3315     Net +869.5 +107         PHYSICAL EXAMINATION: General: awake alert in bed, no distress HEENT: NCAT EOMi PULM: CTA B CV: RRR, no mgr GI: BS+, soft, nontender MSK: normal bulk and tone Derm: no rash or skin breakdown Neuro: Awake and alert, oriented to situation and place and date  LABS:  CBC  Recent Labs Lab 06/21/15 0320 06/22/15 0235 06/23/15 0600  WBC 5.2 9.2 8.8  HGB 11.6* 14.2 14.6  HCT 36.3 43.1 44.0  PLT 142* 191 222   Coag's  Recent Labs Lab 06/19/15 1918  APTT 31  INR 1.01   BMET  Recent Labs Lab 06/21/15 1604  06/22/15 0235   06/22/15 1443 06/22/15 2059 06/23/15 0600  NA 146*  < > 147*  < > 141 148* 150*  K 2.8*  --  2.8*  --   --   --  2.4*  CL 110  --  110  --   --   --  112*  CO2 29  --  26  --   --   --  29  BUN 12  --  14  --   --   --  9  CREATININE 0.44  --  0.52  --   --   --  0.49  GLUCOSE 151*  --  148*  --   --   --  134*  < > = values in this interval not displayed. Electrolytes  Recent Labs Lab 06/21/15 0320 06/21/15 1604 06/22/15 0235 06/23/15 0600  CALCIUM 8.3* 9.0 9.3 8.9  MG 2.0  --  2.1 1.8  PHOS 2.1*  --  <1.0* 1.7*   Sepsis Markers No results for input(s): LATICACIDVEN, PROCALCITON, O2SATVEN in the last 168 hours. ABG  Recent Labs Lab 06/19/15 2152  PHART 7.253*  PCO2ART 47.1*  PO2ART 564*   Liver Enzymes  Recent Labs Lab 06/19/15 1918 06/21/15 1604  AST 19 16  ALT 13*  15  ALKPHOS 56 50  BILITOT 0.6 0.4  ALBUMIN 4.2 3.5   Cardiac Enzymes No results for input(s): TROPONINI, PROBNP in the last 168 hours. 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 P:   Neuro following > care per them  CARDIOVASCULAR A:  Hypertensive emergency with acute ICH Renal artery stenosis Work up for pheochromocytoma and Conn's syndrome pending (likely hyper aldosteronism with low potassium but this is often seen in renal artery stenosis) P:  Cleviprex gtt for goal SBP < 160, currently on 15 and esmolol at 75 mcg drips Adding clonidine 0.1mg  po tid Add spironolactone given likely hyperaldosteronism Agree with renal artery management > though need to clarify stenting (has intracranial hemorrhage and cannot give plavix/asa), so would consider balloon dilation alone Continue high dose hydralazine Max clonidine if still on drips, then  consider minoxidil Labetalol PRN Will need outpatient follow up as well as outpatient antihypertensive regimen  PULMONARY OETT 9/26 >>>9/28 A: VDRF due to inability to protect airway in the setting of acute ICH -- resolved P:   Titrate O2 for sat of 88-92% Albuterol PRN wheezing   RENAL A:   Hypokalemia insetting of hypertension P:   Monitor BMET and UOP Replace electrolytes as needed   GASTROINTESTINAL A:   GI prophylaxis Nutrition P:   SUP: Pantoprazole Advance diet  HEMATOLOGIC A:   VTE Prophylaxis P:  SCD's only CBC prn  INFECTIOUS A:   No indication of infection P:   Monitor clinically  ENDOCRINE A:   Hyperglycemia - no hx DM P:   SSI  Family updated: No family bedside 9/30  Interdisciplinary Family Meeting v Palliative Care Meeting:  Due by: 10/2  My cc time 40 minutes  [place], MD [place] [facility] Pager: [phone] Cell: [phone] After 3pm or if no response, call [phone]

## 2015-06-23 NOTE — Plan of Care (Signed)
Problem: Progression Outcomes Goal: Tolerating diet/TF at goal rate Outcome: Progressing Patient tolerating nectar thick liquids.

## 2015-06-23 NOTE — Progress Notes (Signed)
CRITICAL VALUE ALERT  Critical value received:  K 2.4  Date of notification:  06/23/15  Time of notification:  0657  Critical value read back: yes  Nurse who received alert:  Benna Dunks, RN  MD notified (1st page):  elink  Time of first page:  [phone]  MD notified (2nd page):  Time of second page:  Responding MD:  elink RN stated she may be able to replace per protocol, if not she will pass along to MD.   Time MD responded:  0700

## 2015-06-23 NOTE — Progress Notes (Signed)
   Daily Progress Note  Plasma renin and aldosterone, 24 hour urine catecholamine and metanephrines, plasma metanephrines ordered.  - Dr. Darrick Penna will be covering for my service this weekend - Will proceed with renal angiogram on Monday if medically stable  Leonides Sake, MD [facility] Office: [phone] Pager: [phone]  06/23/2015, 7:17 AM

## 2015-06-23 NOTE — Progress Notes (Signed)
eLink Physician-Brief Progress Note Patient Name: Katie Nichols DOB: 1971/09/08 MRN: [id number]   Date of Service  06/23/2015  HPI/Events of Note  Hypokalemia  eICU Interventions  Potassium replaced     Intervention Category Major Interventions: Electrolyte abnormality - evaluation and management  DETERDING,ELIZABETH 06/23/2015, 9:59 PM

## 2015-06-24 ENCOUNTER — Inpatient Hospital Stay (HOSPITAL_COMMUNITY): Payer: Medicaid Other

## 2015-06-24 DIAGNOSIS — I161 Hypertensive emergency: Secondary | ICD-10-CM

## 2015-06-24 LAB — BASIC METABOLIC PANEL
ANION GAP: 9 (ref 5–15)
BUN: 10 mg/dL (ref 6–20)
CALCIUM: 8.8 mg/dL — AB (ref 8.9–10.3)
CO2: 25 mmol/L (ref 22–32)
Chloride: 108 mmol/L (ref 101–111)
Creatinine, Ser: 0.44 mg/dL (ref 0.44–1.00)
GFR calc non Af Amer: >60 mL/min (ref >60)
Glucose, Bld: 129 mg/dL — ABNORMAL HIGH (ref 65–99)
Potassium: 3.4 mmol/L — ABNORMAL LOW (ref 3.5–5.1)
Sodium: 142 mmol/L (ref 135–145)

## 2015-06-24 LAB — CBC WITH DIFFERENTIAL/PLATELET
BASOS ABS: 0.1 10*3/uL (ref 0.0–0.1)
BASOS PCT: 1 %
Eosinophils Absolute: 0 10*3/uL (ref 0.0–0.7)
Eosinophils Relative: 1 %
HEMATOCRIT: 44.8 % (ref 36.0–46.0)
HEMOGLOBIN: 14.9 g/dL (ref 12.0–15.0)
Lymphocytes Relative: 14 %
Lymphs Abs: 1.1 10*3/uL (ref 0.7–4.0)
MCH: 29 pg (ref 26.0–34.0)
MCHC: 33.3 g/dL (ref 30.0–36.0)
MCV: 87.2 fL (ref 78.0–100.0)
MONOS PCT: 10 %
Monocytes Absolute: 0.7 10*3/uL (ref 0.1–1.0)
NEUTROS ABS: 5.7 10*3/uL (ref 1.7–7.7)
NEUTROS PCT: 74 %
Platelets: 229 10*3/uL (ref 150–400)
RBC: 5.14 MIL/uL — ABNORMAL HIGH (ref 3.87–5.11)
RDW: 14.3 % (ref 11.5–15.5)
WBC: 7.6 10*3/uL (ref 4.0–10.5)

## 2015-06-24 LAB — SODIUM
SODIUM: 141 mmol/L (ref 135–145)
SODIUM: 141 mmol/L (ref 135–145)
Sodium: 142 mmol/L (ref 135–145)

## 2015-06-24 LAB — GLUCOSE, CAPILLARY
GLUCOSE-CAPILLARY: 122 mg/dL — AB (ref 65–99)
GLUCOSE-CAPILLARY: 148 mg/dL — AB (ref 65–99)
Glucose-Capillary: 107 mg/dL — ABNORMAL HIGH (ref 65–99)
Glucose-Capillary: 111 mg/dL — ABNORMAL HIGH (ref 65–99)
Glucose-Capillary: 126 mg/dL — ABNORMAL HIGH (ref 65–99)

## 2015-06-24 LAB — METANEPHRINES, PLASMA
METANEPHRINE FREE: 71 pg/mL — AB (ref 0–62)
Normetanephrine, Free: 111 pg/mL (ref 0–145)

## 2015-06-24 MED ORDER — LABETALOL HCL 200 MG PO TABS
200.0000 mg | ORAL_TABLET | Freq: Two times a day (BID) | ORAL | Status: DC
  Administered 2015-06-24 – 2015-06-26 (×6): 200 mg via ORAL
  Filled 2015-06-24: qty 1
  Filled 2015-06-24 (×5): qty 2
  Filled 2015-06-24: qty 1
  Filled 2015-06-24: qty 2

## 2015-06-24 MED ORDER — LISINOPRIL 10 MG PO TABS
10.0000 mg | ORAL_TABLET | Freq: Every day | ORAL | Status: DC
  Administered 2015-06-24: 10 mg via ORAL
  Filled 2015-06-24 (×2): qty 1

## 2015-06-24 MED ORDER — CLONIDINE HCL 0.1 MG PO TABS
0.2000 mg | ORAL_TABLET | Freq: Three times a day (TID) | ORAL | Status: DC
  Administered 2015-06-24: 0.2 mg via ORAL
  Filled 2015-06-24: qty 2

## 2015-06-24 NOTE — Progress Notes (Signed)
D/w dr Pearlean Brownie - stroke team primary. PCCM will sign off  Dr. Kalman Shan, M.D., [facility].C.P Pulmonary and Critical Care Medicine Staff Physician  System [place] [facility] Pager: [phone], If no answer or between  15:00h - 7:00h: call [phone]  06/24/2015 9:26 AM

## 2015-06-24 NOTE — Consult Note (Signed)
Referring Provider: No ref. provider found Primary Care Physician:  No primary care provider on file. Primary Nephrologist:    Reason for Consultation:  Control of Hypertension after admission for hypertensive emergency and acute intracerebral bleed  HPI: This is a 44 y.o. female who was admitted 3 days ago after being found on the floor with right sided weakness and sensory deficits. She was brought to the hospital as a code stroke. She was hypertensive with BP of 280/180 and HR in 120's. She did have a CT scan, which revealed left basal ganglia ICH with minimal intraventricular extension with around 3mm midline shift. There were no signs of hydrocephalus. She was intubated for airway protection. She was extubated yesterday. Her blood pressure has remained elevated despite gtts/medications. She had a renal duplex, which revealed a right RAS of 60-99% stenosis. VVS is consulted.   She was not on any antihypertensives prior to admission. She did take Goody's powder for HA. She is on albuterol prn for wheezing/sob.  Her father has controlled HTN, DM, and CAD with hx of CABG x 6. Her mother also has HTN and takes an ACEI.  She has normal renal function at baseline  Past Medical History  Diagnosis Date  . Hypertension     No past surgical history on file.  Prior to Admission medications   Medication Sig Start Date End Date Taking? Authorizing Provider  albuterol (PROVENTIL HFA;VENTOLIN HFA) 108 (90 BASE) MCG/ACT inhaler Inhale 2 puffs into the lungs every 6 (six) hours as needed for wheezing or shortness of breath.   Yes Historical Provider, MD  Aspirin-Acetaminophen-Caffeine (GOODY HEADACHE PO) Take 1 packet by mouth daily.   Yes Historical Provider, MD    Current Facility-Administered Medications  Medication Dose Route Frequency Provider Last Rate Last Dose  . 0.9 %  sodium chloride infusion   Intra-arterial PRN Theda Belfast, MD      . acetaminophen (TYLENOL) tablet 650 mg   650 mg Oral Q4H PRN Ramond Marrow, DO       Or  . acetaminophen (TYLENOL) suppository 650 mg  650 mg Rectal Q4H PRN Ramond Marrow, DO      . albuterol (PROVENTIL) (2.5 MG/3ML) 0.083% nebulizer solution 2.5 mg  2.5 mg Nebulization Q3H PRN Rahul P Desai, PA-C   2.5 mg at 06/24/15 1353  . amLODipine (NORVASC) tablet 10 mg  10 mg Oral Daily Marvel Plan, MD   10 mg at 06/24/15 1007  . antiseptic oral rinse solution (CORINZ)  7 mL Mouth Rinse QID Md Pccm, MD   7 mL at 06/24/15 1300  . atorvastatin (LIPITOR) tablet 10 mg  10 mg Oral q1800 Marvel Plan, MD   10 mg at 06/23/15 1834  . chlorhexidine gluconate (PERIDEX) 0.12 % solution 15 mL  15 mL Mouth Rinse BID Md Pccm, MD   15 mL at 06/24/15 0900  . clevidipine (CLEVIPREX) infusion 0.5 mg/mL  0-25 mg/hr Intravenous Continuous Micki Riley, MD 20 mL/hr at 06/24/15 1407 10 mg/hr at 06/24/15 1407  . esmolol (BREVIBLOC) 2000 mg / 100 mL (20 mg/mL) infusion  25-300 mcg/kg/min Intravenous Titrated Micki Riley, MD   Stopped at 06/24/15 1211  . heparin injection 5,000 Units  5,000 Units Subcutaneous 3 times per day Marvel Plan, MD   5,000 Units at 06/24/15 1421  . hydrALAZINE (APRESOLINE) tablet 100 mg  100 mg Oral 3 times per day Marvel Plan, MD   100 mg at 06/24/15 1421  . labetalol (NORMODYNE) tablet 200  mg  200 mg Oral BID Elvis Coil, MD      . labetalol (NORMODYNE,TRANDATE) injection 10-40 mg  10-40 mg Intravenous Q10 min PRN Ramond Marrow, DO   40 mg at 06/22/15 0710  . lisinopril (PRINIVIL,ZESTRIL) tablet 10 mg  10 mg Oral Daily Elvis Coil, MD      . multivitamin with minerals tablet 1 tablet  1 tablet Per Tube Daily Arlyss Gandy, RD   1 tablet at 06/24/15 1006  . pantoprazole (PROTONIX) EC tablet 40 mg  40 mg Oral Daily Darl Householder Masters, RPH   40 mg at 06/24/15 1006  . senna-docusate (Senokot-S) tablet 1 tablet  1 tablet Oral BID Ramond Marrow, DO   1 tablet at 06/24/15 1006  . sodium chloride (hypertonic) 3 % solution   Intravenous Continuous  Marvel Plan, MD   Stopped at 06/24/15 0902    Allergies as of 06/19/2015  . (No Known Allergies)    Family History  Problem Relation Age of Onset  . Heart failure Mother   . Diabetes Father   . Cancer Father   . Hypertension Father   . Coronary artery disease Father     hx CABG x 6    Social History   Social History  . Marital Status: Divorced    Spouse Name: N/A  . Number of Children: N/A  . Years of Education: N/A   Occupational History  . Not on file.   Social History Main Topics  . Smoking status: Not on file  . Smokeless tobacco: Not on file  . Alcohol Use: Not on file  . Drug Use: Not on file  . Sexual Activity: Not on file   Other Topics Concern  . Not on file   Social History Narrative    Review of Systems: Gen: Denies any fever, chills, sweats, anorexia, fatigue, weakness, malaise, weight loss, and sleep disorder HEENT: No visual complaints, No history of Retinopathy. Normal external appearance No Epistaxis or Sore throat. No sinusitis.   CV: Denies chest pain, angina, palpitations, syncope, orthopnea, PND, peripheral edema, and claudication. Resp: Denies dyspnea at rest, dyspnea with exercise, cough, sputum, wheezing, coughing up blood, and pleurisy. GI: Denies vomiting blood, jaundice, and fecal incontinence.   Denies dysphagia or odynophagia. GU : Denies urinary burning, blood in urine, urinary frequency, urinary hesitancy, nocturnal urination, and urinary incontinence.  No renal calculi. MS: Denies joint pain, limitation of movement, and swelling, stiffness, low back pain, extremity pain. Denies muscle weakness, cramps, atrophy.  No use of non steroidal antiinflammatory drugs. Derm: Denies rash, itching, dry skin, hives, moles, warts, or unhealing ulcers.  Psych: Denies depression, anxiety, memory loss, suicidal ideation, hallucinations, paranoia, and confusion. Heme: Denies bruising, bleeding, and enlarged lymph nodes. Neuro: No headache.  No diplopia.  No dysarthria.  No dysphasia.  No history of CVA.  No Seizures. No paresthesias.  No weakness. Endocrine No DM.  No Thyroid disease.  No Adrenal disease.  Physical Exam: Vital signs in last 24 hours: Temp:  [98.8 F (37.1 C)-100 F (37.8 C)] 99 F (37.2 C) (10/01 1445) Pulse Rate:  [61-112] 79 (10/01 1445) Resp:  [18-44] 41 (10/01 1445) BP: (119-172)/(62-108) 158/98 mmHg (10/01 1445) SpO2:  [91 %-98 %] 95 % (10/01 1445) Last BM Date:  (pta) General:   Alert,  Well-developed, well-nourished, pleasant and cooperative in NAD Head:  Normocephalic and atraumatic. Eyes:  Sclera clear, no icterus.   Conjunctiva pink. Ears:  Normal auditory acuity. Nose:  No  deformity, discharge,  or lesions. Mouth:  No deformity or lesions, dentition normal. Neck:  Supple; no masses or thyromegaly. JVP not elevated Lungs:  Clear throughout to auscultation.   No wheezes, crackles, or rhonchi. No acute distress. Heart:  Regular rate and rhythm; no murmurs, clicks, rubs,  or gallops. Abdomen:  Soft, nontender and nondistended. No masses, hepatosplenomegaly or hernias noted. Normal bowel sounds, without guarding, and without rebound.   Msk:  Symmetrical without gross deformities. Normal posture. Pulses:  No carotid, renal, femoral bruits. DP and PT symmetrical and equal Extremities:  Without clubbing or edema. Neurologic:  Alert and  oriented x4;  grossly normal neurologically. Skin:  Intact without significant lesions or rashes. Cervical Nodes:  No significant cervical adenopathy. Psych:  Alert and cooperative. Normal mood and affect.  Intake/Output from previous day: 09/30 0701 - 10/01 0700 In: 2889.6 [P.O.:720; I.V.:1919.6; IV Piggyback:250] Out: 3165 [Urine:3165] Intake/Output this shift: Total I/O In: 554 [P.O.:240; I.V.:314] Out: 640 [Urine:640]  Lab Results:  Recent Labs  06/22/15 0235 06/23/15 0600 06/24/15 0621  WBC 9.2 8.8 7.6  HGB 14.2 14.6 14.9  HCT 43.1 44.0 44.8  PLT 191 222 229    BMET  Recent Labs  06/22/15 0235  06/23/15 0600  06/23/15 2104 06/24/15 0621 06/24/15 0915  NA 147*  < > 150*  < > 147* 142 142  K 2.8*  --  2.4*  --  2.8* 3.4*  --   CL 110  --  112*  --  113* 108  --   CO2 26  --  29  --  26 25  --   GLUCOSE 148*  --  134*  --  133* 129*  --   BUN 14  --  9  --  10 10  --   CREATININE 0.52  --  0.49  --  0.45 0.44  --   CALCIUM 9.3  --  8.9  --  8.8* 8.8*  --   PHOS <1.0*  --  1.7*  --   --   --   --   < > = values in this interval not displayed. LFT  Recent Labs  06/21/15 1604  PROT 7.1  ALBUMIN 3.5  AST 16  ALT 15  ALKPHOS 50  BILITOT 0.4   PT/INR No results for input(s): LABPROT, INR in the last 72 hours. Hepatitis Panel No results for input(s): HEPBSAG, HCVAB, HEPAIGM, HEPBIGM in the last 72 hours.  Studies/Results: Ct Head Wo Contrast  06/23/2015   CLINICAL DATA:  Follow-up intracranial hemorrhage. History of hypertension.  EXAM: CT HEAD WITHOUT CONTRAST  TECHNIQUE: Contiguous axial images were obtained from the base of the skull through the vertex without intravenous contrast.  COMPARISON:  CT head June 21, 2015.  FINDINGS: Evolving 25 x 21 mm LEFT thalamus intraparenchymal hematoma was 26 x 23 mm. Local mass effect, partially effacing the third ventricle. Intraventricular blood products, with mild dilatation of the temporal horns which is similar. Mild global parenchymal edema. No acute large vascular territory infarct. Slightly effaced basal cisterns.  Mild calcific atherosclerosis of the carotid siphons and included vertebral arteries, advanced for age. Ocular globes and orbital contents are unremarkable. Imaged paranasal sinuses and mastoid air cells are well aerated. No skull fracture.  IMPRESSION: Evolving LEFT thalamus intraparenchymal hematoma, local mass-effect.  Similar intraventricular blood products, mild hydrocephalus. Mild global parenchymal edema.   Electronically Signed   By: Awilda Metro M.D.   On:  06/23/2015 05:33   Dg Chest  Port 1 View  06/24/2015   CLINICAL DATA:  Acute respiratory failure  EXAM: PORTABLE CHEST 1 VIEW  COMPARISON:  Two days ago  FINDINGS: Tracheal and esophageal extubation. Right IJ central line has been slightly retracted, tip near the SVC origin.  Stable borderline cardiomegaly. Stable aortic tortuosity, accentuated by rightward rotation. There is no edema, consolidation, effusion, or pneumothorax.  IMPRESSION: 1. Stable clear lungs post extubation. 2. Shortening of right IJ line, tip near the SVC origin.   Electronically Signed   By: Marnee Spring M.D.   On: 06/24/2015 06:54    Assessment/Plan:  This is a 44 year old lady with history of hypertensive urgency and an acute intracerebral bleed. She has renal artery stenosis suspected on duplex and would think that she will still need evaluation for Phaeochromocytoma and Adrenal adenoma. As she is on multiple hypertensives all of which will make it difficult to interpret her hormone studies we shall check a CT scan of her abdomen.  Her BP control would be better if it was simplified  To labetalol  BID and also continuing amlodipine  q day I shall also start lisinopril  daily  Her renal artery disease would need to be evaluated after it is safe for her to receive aspirin and plavix    LOS: 5 Buford Gayler W  :15 PM

## 2015-06-24 NOTE — Progress Notes (Signed)
STROKE TEAM PROGRESS NOTE   SUBJECTIVE (INTERVAL HISTORY) No family is at the bedside. Neuro stable overnight. Repeat CT 06/23/15 no change.  off 3% saline. Sodium 142. However, her BP still not in good control. maxed out on cleviprex and also on esmolol now. Started po meds but not very effective. Renal US showed right RAS. VVS on board. Plan angio on monday   OBJECTIVE Temp:  [98.8 F (37.1 C)-100 F (37.8 C)] 99 F (37.2 C) (10/01 1000) Pulse Rate:  [72-112] 80 (10/01 1000) Cardiac Rhythm:  [-] Normal sinus rhythm (10/01 0800) Resp:  [17-53] 23 (10/01 1000) BP: (122-172)/(67-101) 147/82 mmHg (10/01 1006) SpO2:  [91 %-98 %] 93 % (10/01 1000)  CBC:  Recent Labs Lab 06/19/15 1918  06/23/15 0600 06/24/15 0621  WBC 6.8  < > 8.8 7.6  NEUTROABS 4.3  --   --  5.7  HGB 14.7  < > 14.6 14.9  HCT 43.1  < > 44.0 44.8  MCV 86.2  < > 87.6 87.2  PLT 199  < > 222 229  < > = values in this interval not displayed.  Basic Metabolic Panel:  Recent Labs Lab 06/22/15 0235  06/23/15 0600  06/23/15 2104 06/24/15 0621 06/24/15 0915  NA 147*  < > 150*  < > 147* 142 142  K 2.8*  --  2.4*  --  2.8* 3.4*  --   CL 110  --  112*  --  113* 108  --   CO2 26  --  29  --  26 25  --   GLUCOSE 148*  --  134*  --  133* 129*  --   BUN 14  --  9  --  10 10  --   CREATININE 0.52  --  0.49  --  0.45 0.44  --   CALCIUM 9.3  --  8.9  --  8.8* 8.8*  --   MG 2.1  --  1.8  --   --   --   --   PHOS <1.0*  --  1.7*  --   --   --   --   < > = values in this interval not displayed.  Lipid Panel:     Component Value Date/Time   CHOL 176 06/20/2015 2120   TRIG 69 06/20/2015 2120   HDL 53 06/20/2015 2120   CHOLHDL 3.3 06/20/2015 2120   VLDL 14 06/20/2015 2120   LDLCALC 109* 06/20/2015 2120   HgbA1c:  Lab Results  Component Value Date   HGBA1C 5.6 06/19/2015   Urine Drug Screen:     Component Value Date/Time   LABOPIA NONE DETECTED 06/21/2015 1233   COCAINSCRNUR NONE DETECTED 06/21/2015 1233    LABBENZ NONE DETECTED 06/21/2015 1233   AMPHETMU NONE DETECTED 06/21/2015 1233   THCU POSITIVE* 06/21/2015 1233   LABBARB NONE DETECTED 06/21/2015 1233      IMAGING I have personally reviewed the radiological images below and agree with the radiology interpretations. Blue text is my interpretation.  Ct Head Wo Contrast  06/23/2015   IMPRESSION: Evolving LEFT thalamus intraparenchymal hematoma, local mass-effect.  Similar intraventricular blood products, mild hydrocephalus. Mild global parenchymal edema.    06/21/2015   IMPRESSION: 1. Stable size of left thalamic hematoma with similar localized vasogenic edema. 2. Overall slight interval decrease in intraventricular hemorrhage with stable mild dilatation of the temporal horns. 3. Stable 6 mm left-to-right shift at the level of the hematoma.   06/20/2015  1. Unchanged left thalamic hematoma.  2. Newly seen right lateral ventricle and interpeduncular fossa hemorrhage is likely redistribution. Overall, intraventricular hematoma volume is similar to yesterday. However, more prominent midline shift comparing with last CT head. 3. Stable mild dilation of the temporal horns.     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 No early hydrocephalus. 3 mm of LEFT-to- RIGHT midline shift.    Dg Chest Port 1 View 06/19/2015    1. Endotracheal tube tip 26 mm from the carina, in good position.  2. Enteric tube tip not visible.  3. No focal consolidation. Suggestion of pulmonary vascular congestion.     2D ehco - - Left ventricle: The cavity size was normal. Wall thickness was normal. Systolic function was normal. The estimated ejection fraction was in the range of 60% to 65%. Wall motion was normal; there were no regional wall motion abnormalities. Doppler parameters are consistent with abnormal left ventricular relaxation (grade 1 diastolic dysfunction). - Mitral valve: Transvalvular velocity was  within the normal range. There was no evidence for stenosis. There was no regurgitation. - Right ventricle: The cavity size was normal. Wall thickness was normal. Systolic function was normal. - Tricuspid valve: There was trivial regurgitation. - Pulmonary arteries: PA peak pressure: 40 mm Hg (S). - Inferior vena cava: The vessel was normal in size. The respirophasic diameter changes were blunted (< 50%), consistent with normal central venous pressure.  Renal artery [place] - Elevated velocities in the right renal artery are consistent with 60-99% stenosis. Left renal artery appeared within normal limits. Results given to patient's nurse.    PHYSICAL EXAM  Temp:  [98.8 F (37.1 C)-100 F (37.8 C)] 99 F (37.2 C) (10/01 1000) Pulse Rate:  [72-112] 80 (10/01 1000) Resp:  [17-53] 23 (10/01 1000) BP: (122-172)/(67-101) 147/82 mmHg (10/01 1006) SpO2:  [91 %-98 %] 93 % (10/01 1000)  General - obese, well developed, awake alert.  Ophthalmologic - Fundi not visualized due to noncooperation.  Cardiovascular - Regular rate and rhythm with no murmur.  Neuro - awake alert, able to follow all simple commands, with transcortical motor aphasia. nonfluent speech.PERRL, EOMI, blinking to visual threat on the both sides, right facial droop, RUE and RLE 0/5 even on pain stimulation, right side decreased sensation with pain stimulation, LUE and LLE spontaneous movement against gravity, babinski mute b/l, DTR 1+. No ataxia on the LUE.    ASSESSMENT/PLAN Ms. SAMAIRA HOLZWORTH is a 44 y.o. female with history of hypertension and asthma presenting with right-sided weakness and sensory deficits with blood pressure  280/180. She did not receive IV t-PA due to acute left basal ganglia hemorrhage.   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   Resultant  Right hemiplegia, transcortical motor  aphasia.  MRI / MRA not ordered yet   Repeat CT head showed stable hematoma and no hydrocephalus  UDS - positive for THC  Renal artery US showed right RAS  2D Echo unremarkable  LDL 109  HgbA1c 5.6  VTE prophylaxis subq heparin  DIET DYS 3 Room service appropriate?: Yes; Fluid consistency:: Nectar Thick  no antithrombotic prior to admission, now on no antithrombotic secondary to hemorrhage  Ongoing aggressive stroke risk factor management  Therapy recommendations: Pending  Disposition: Pending  Cerebral edema  Although not large volume ICH, however, medial thalamic ICH causing significant midline shift and uncal herniation  Repeat  CT stable  taper off 3% saline  Na 136 -> 144 -> 147 -> 148 -> 150  Hypertension, malignant  On cleviprex and esmolol drip Plan to taper drips  Change BP goal < 180 On po meds with amlodipine, metoprolol, hydralazine, clonidine and spiranolactone Renal artery ultrasound showed right RAS Check renin, ADH and cathecholamines - pending Consult Renal service foe BP management  RAS  Ultrasound showed right RAS  VVS on board  Consider angiogram on Monday  Hyperlipidemia   Not on any home meds  LDL 109, goal < 70  Add lipitor    Continue statin on discharge.  Other Stroke Risk Factors  Obesity, Body mass index is 31.83 kg/(m^2).   Other Active Problems  Hypokalemia - on supplement  Hospital day # 5  This patient is critically ill due to left thalamic ICH with ventricular extension, uncontrolled BP, right RAS and at significant risk of neurological worsening, death form enlargement of hematoma, obstructive hydrocephalus, HTN emergency, heart failure, CHF, cerebral edma and brain herniation. 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 Marchelle Gearing I spent of neurocritical care time in the care of this patient.   Marvel Plan, MD PhD Stroke Neurology 06/24/2015 11:16 AM   To contact Stroke Continuity provider, please refer to [email or web address]. After hours, contact General Neurology

## 2015-06-24 NOTE — Progress Notes (Signed)
PT Cancellation Note/Discharge  Patient Details Name: Katie Nichols MRN: [id number] DOB: 09/18/1971   Cancelled Treatment:    Reason Eval/Treat Not Completed: Medical issues which prohibited therapy (pt remains on bedrest, not medically appropriate and has been cancelled for the last 5 days. Will sign off and await new order as pt appropriate)   Delorse Lek 06/24/2015, 11:02 AM Delaney Meigs, PT [phone]

## 2015-06-25 ENCOUNTER — Inpatient Hospital Stay (HOSPITAL_COMMUNITY): Payer: Medicaid Other

## 2015-06-25 DIAGNOSIS — I619 Nontraumatic intracerebral hemorrhage, unspecified: Secondary | ICD-10-CM

## 2015-06-25 LAB — ALDOSTERONE + RENIN ACTIVITY W/ RATIO
ALDO / PRA Ratio: >11.3 (ref 0.0–30.0)
Aldosterone: 1.7 ng/dL (ref 0.0–30.0)
PRA LC/MS/MS: <0.15 ng/mL/hr

## 2015-06-25 LAB — SODIUM
SODIUM: 140 mmol/L (ref 135–145)
Sodium: 139 mmol/L (ref 135–145)

## 2015-06-25 MED ORDER — IOHEXOL 300 MG/ML  SOLN
100.0000 mL | Freq: Once | INTRAMUSCULAR | Status: AC | PRN
  Administered 2015-06-25: 100 mL via INTRAVENOUS

## 2015-06-25 MED ORDER — LISINOPRIL 10 MG PO TABS
10.0000 mg | ORAL_TABLET | Freq: Two times a day (BID) | ORAL | Status: DC
  Administered 2015-06-25 – 2015-06-26 (×3): 10 mg via ORAL
  Filled 2015-06-25 (×4): qty 1

## 2015-06-25 MED ORDER — IOHEXOL 300 MG/ML  SOLN
50.0000 mL | Freq: Once | INTRAMUSCULAR | Status: AC | PRN
  Administered 2015-06-25 (×2): 25 mL via ORAL

## 2015-06-25 NOTE — Progress Notes (Signed)
[facility] ROUNDING NOTE   Subjective:   Interval History: being evaluated for adrenal mass  Objective:  Vital signs in last 24 hours:  Temp:  [98.4 F (36.9 C)-99.1 F (37.3 C)] 98.8 F (37.1 C) (10/02 0900) Pulse Rate:  [60-97] 74 (10/02 0900) Resp:  [15-41] 23 (10/02 0900) BP: (114-185)/(62-164) 172/87 mmHg (10/02 0900) SpO2:  [91 %-96 %] 94 % (10/02 0900)  Weight change:  Filed Weights   06/21/15 0600 06/22/15 0500 06/23/15 0500  Weight: 93.1 kg (205 lb 4 oz) 88.3 kg (194 lb 10.7 oz) 89.4 kg (197 lb 1.5 oz)    Intake/Output: I/O last 3 completed shifts: In: 3043.5 [P.O.:1200; I.V.:1593.5; IV Piggyback:250] Out: 2575 [Urine:2575]   Intake/Output this shift:  Total I/O In: 16 [I.V.:16] Out: 295 [Urine:295]  CVS- RRR RS- CTA ABD- BS present soft non-distended EXT- no edema   Basic Metabolic Panel:  Recent Labs Lab 06/20/15 0400  06/21/15 0320  06/21/15 1604  06/22/15 0235  06/23/15 0600  06/23/15 2104 06/24/15 0621 06/24/15 0915 06/24/15 1550 06/24/15 2130 06/25/15 0215  NA 136  < > 144  < > 146*  < > 147*  < > 150*  < > 147* 142 142 141 141 139  K 4.1  --  3.7  --  2.8*  --  2.8*  --  2.4*  --  2.8* 3.4*  --   --   --   --   CL 105  --  115*  --  110  --  110  --  112*  --  113* 108  --   --   --   --   CO2 23  --  23  --  29  --  26  --  29  --  26 25  --   --   --   --   GLUCOSE 74  --  121*  --  151*  --  148*  --  134*  --  133* 129*  --   --   --   --   BUN 21*  --  21*  --  12  --  14  --  9  --  10 10  --   --   --   --   CREATININE 1.03*  --  0.54  --  0.44  --  0.52  --  0.49  --  0.45 0.44  --   --   --   --   CALCIUM 8.2*  --  8.3*  --  9.0  --  9.3  --  8.9  --  8.8* 8.8*  --   --   --   --   MG 1.9  --  2.0  --   --   --  2.1  --  1.8  --   --   --   --   --   --   --   PHOS 4.6  --  2.1*  --   --   --  <1.0*  --  1.7*  --   --   --   --   --   --   --   < > = values in this interval not displayed.  Liver Function  Tests:  Recent Labs Lab 06/19/15 1918 06/21/15 1604  AST 19 16  ALT 13* 15  ALKPHOS 56 50  BILITOT 0.6 0.4  PROT 7.3 7.1  ALBUMIN 4.2 3.5   No results  for input(s): LIPASE, AMYLASE in the last 168 hours. No results for input(s): AMMONIA in the last 168 hours.  CBC:  Recent Labs Lab 06/19/15 1918  06/20/15 0400 06/21/15 0320 06/22/15 0235 06/23/15 0600 06/24/15 0621  WBC 6.8  --  6.6 5.2 9.2 8.8 7.6  NEUTROABS 4.3  --   --   --   --   --  5.7  HGB 14.7  < > 13.7 11.6* 14.2 14.6 14.9  HCT 43.1  < > 41.1 36.3 43.1 44.0 44.8  MCV 86.2  --  88.2 89.0 88.5 87.6 87.2  PLT 199  --  190 142* 191 222 229  < > = values in this interval not displayed.  Cardiac Enzymes: No results for input(s): CKTOTAL, CKMB, CKMBINDEX, TROPONINI in the last 168 hours.  BNP: Invalid input(s): POCBNP  CBG:  Recent Labs Lab 06/24/15 0026 06/24/15 0340 06/24/15 0731 06/24/15 1117 06/24/15 1541  GLUCAP 126* 122* 148* 107* 111*    Microbiology: Results for orders placed or performed during the hospital encounter of 06/19/15  MRSA PCR Screening     Status: None   Collection Time: 06/20/15 12:36 AM  Result Value Ref Range Status   MRSA by PCR NEGATIVE NEGATIVE Final    Comment:        The GeneXpert MRSA Assay (FDA approved for NASAL specimens only), is one component of a comprehensive MRSA colonization surveillance program. It is not intended to diagnose MRSA infection nor to guide or monitor treatment for MRSA infections.     Coagulation Studies: No results for input(s): LABPROT, INR in the last 72 hours.  Urinalysis: No results for input(s): COLORURINE, LABSPEC, PHURINE, GLUCOSEU, HGBUR, BILIRUBINUR, KETONESUR, PROTEINUR, UROBILINOGEN, NITRITE, LEUKOCYTESUR in the last 72 hours.  Invalid input(s): APPERANCEUR    Imaging: Dg Chest Port 1 View  06/24/2015   CLINICAL DATA:  Acute respiratory failure  EXAM: PORTABLE CHEST 1 VIEW  COMPARISON:  Two days ago  FINDINGS:  Tracheal and esophageal extubation. Right IJ central line has been slightly retracted, tip near the SVC origin.  Stable borderline cardiomegaly. Stable aortic tortuosity, accentuated by rightward rotation. There is no edema, consolidation, effusion, or pneumothorax.  IMPRESSION: 1. Stable clear lungs post extubation. 2. Shortening of right IJ line, tip near the SVC origin.   Electronically Signed   By: Marnee Spring M.D.   On: 06/24/2015 06:54     Medications:   . clevidipine 4 mg/hr (06/25/15 0900)  . esmolol Stopped (06/24/15 1211)  . sodium chloride (hypertonic) Stopped (06/24/15 0902)   . amLODipine  10 mg Oral Daily  . antiseptic oral rinse  7 mL Mouth Rinse QID  . atorvastatin  10 mg Oral q1800  . chlorhexidine gluconate  15 mL Mouth Rinse BID  . heparin subcutaneous  5,000 Units Subcutaneous 3 times per day  . hydrALAZINE  100 mg Oral 3 times per day  . labetalol  200 mg Oral BID  . lisinopril  10 mg Oral BID  . multivitamin with minerals  1 tablet Per Tube Daily  . pantoprazole  40 mg Oral Daily  . senna-docusate  1 tablet Oral BID   Place/Maintain arterial line **AND** sodium chloride, acetaminophen **OR** acetaminophen, albuterol, iohexol, labetalol  Assessment/ Plan:   This is a 44 year old lady with history of hypertensive urgency and an acute intracerebral bleed. She has renal artery stenosis suspected on duplex and would think that she will still need evaluation for Phaeochromocytoma and Adrenal adenoma. As she  is on multiple hypertensives all of which will make it difficult to interpret her hormone studies we shall check a CT scan of her abdomen.  Her BP control would be better if it was simplified To labetalol  BID and also continuing amlodipine  q day increase lisinopril to BID  Her renal artery disease would need to be evaluated after it is safe for her to receive aspirin and plavix    LOS: 6 Ivet Guerrieri W  :10 AM

## 2015-06-25 NOTE — Consult Note (Signed)
[facility]  Subjective  - feels ok but nervous   Objective 172/87 74 98.8 F (37.1 C) (Core (Comment)) 23 94%  Intake/Output Summary (Last 24 hours) at 06/25/15 0932 Last data filed at 06/25/15 0900  Gross per 24 hour  Intake 663.58 ml  Output   1095 ml  Net -431.42 ml   Neuro: Complete right hemiplegia  Assessment/Planning: Possible renal artery stenosis.  Pt with recent intracranial bleed.  Agree with Dr Hyman Hopes, neurology and critical care that we should delay any renal artery intervention right now in the face of recent intracranial bleed.  Will cancel agram for tomorrow.  Will inform Dr Imogene Burn who will follow up with pt.  Fabienne Bruns 06/25/2015 9:32 AM --  Laboratory Lab Results:  Recent Labs  06/23/15 0600 06/24/15 0621  WBC 8.8 7.6  HGB 14.6 14.9  HCT 44.0 44.8  PLT 222 229   BMET  Recent Labs  06/23/15 2104 06/24/15 0621  06/24/15 2130 06/25/15 0215  NA 147* 142  < > 141 139  K 2.8* 3.4*  --   --   --   CL 113* 108  --   --   --   CO2 26 25  --   --   --   GLUCOSE 133* 129*  --   --   --   BUN 10 10  --   --   --   CREATININE 0.45 0.44  --   --   --   CALCIUM 8.8* 8.8*  --   --   --   < > = values in this interval not displayed.  COAG Lab Results  Component Value Date   INR 1.01 06/19/2015   No results found for: PTT

## 2015-06-25 NOTE — Progress Notes (Signed)
STROKE TEAM PROGRESS NOTE   SUBJECTIVE (INTERVAL HISTORY) No family is at the bedside. Neuro stable overnight. Blood pressure slightly better controlled after oral medications adjusted by nephrology. She is off esmolol drip and cleviprex is being weaned off  OBJECTIVE Temp:  [98.4 F (36.9 C)-99.1 F (37.3 C)] 98.6 F (37 C) (10/02 1030) Pulse Rate:  [60-97] 81 (10/02 1130) Cardiac Rhythm:  [-] Normal sinus rhythm (10/02 0800) Resp:  [14-41] 20 (10/02 1130) BP: (114-185)/(62-164) 173/95 mmHg (10/02 1130) SpO2:  [91 %-97 %] 95 % (10/02 1130)  CBC:  Recent Labs Lab 06/19/15 1918  06/23/15 0600 06/24/15 0621  WBC 6.8  < > 8.8 7.6  NEUTROABS 4.3  --   --  5.7  HGB 14.7  < > 14.6 14.9  HCT 43.1  < > 44.0 44.8  MCV 86.2  < > 87.6 87.2  PLT 199  < > 222 229  < > = values in this interval not displayed.  Basic Metabolic Panel:  Recent Labs Lab 06/22/15 0235  06/23/15 0600  06/23/15 2104 06/24/15 0621  06/25/15 0215 06/25/15 1030  NA 147*  < > 150*  < > 147* 142  < > 139 140  K 2.8*  --  2.4*  --  2.8* 3.4*  --   --   --   CL 110  --  112*  --  113* 108  --   --   --   CO2 26  --  29  --  26 25  --   --   --   GLUCOSE 148*  --  134*  --  133* 129*  --   --   --   BUN 14  --  9  --  10 10  --   --   --   CREATININE 0.52  --  0.49  --  0.45 0.44  --   --   --   CALCIUM 9.3  --  8.9  --  8.8* 8.8*  --   --   --   MG 2.1  --  1.8  --   --   --   --   --   --   PHOS <1.0*  --  1.7*  --   --   --   --   --   --   < > = values in this interval not displayed.  Lipid Panel:     Component Value Date/Time   CHOL 176 06/20/2015 2120   TRIG 69 06/20/2015 2120   HDL 53 06/20/2015 2120   CHOLHDL 3.3 06/20/2015 2120   VLDL 14 06/20/2015 2120   LDLCALC 109* 06/20/2015 2120   HgbA1c:  Lab Results  Component Value Date   HGBA1C 5.6 06/19/2015   Urine Drug Screen:     Component Value Date/Time   LABOPIA NONE DETECTED 06/21/2015 1233   COCAINSCRNUR NONE DETECTED 06/21/2015  1233   LABBENZ NONE DETECTED 06/21/2015 1233   AMPHETMU NONE DETECTED 06/21/2015 1233   THCU POSITIVE* 06/21/2015 1233   LABBARB NONE DETECTED 06/21/2015 1233      IMAGING I have personally reviewed the radiological images below and agree with the radiology interpretations. Blue text is my interpretation.  Ct Head Wo Contrast  06/23/2015   IMPRESSION: Evolving LEFT thalamus intraparenchymal hematoma, local mass-effect.  Similar intraventricular blood products, mild hydrocephalus. Mild global parenchymal edema.    06/21/2015   IMPRESSION: 1. Stable size of left thalamic hematoma with similar localized vasogenic  edema. 2. Overall slight interval decrease in intraventricular hemorrhage with stable mild dilatation of the temporal horns. 3. Stable 6 mm left-to-right shift at the level of the hematoma.   06/20/2015    1. Unchanged left thalamic hematoma.  2. Newly seen right lateral ventricle and interpeduncular fossa hemorrhage is likely redistribution. Overall, intraventricular hematoma volume is similar to yesterday. However, more prominent midline shift comparing with last CT head. 3. Stable mild dilation of the temporal horns.     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 No early hydrocephalus. 3 mm of LEFT-to- RIGHT midline shift.    Dg Chest Port 1 View 06/19/2015    1. Endotracheal tube tip 26 mm from the carina, in good position.  2. Enteric tube tip not visible.  3. No focal consolidation. Suggestion of pulmonary vascular congestion.     2D ehco - - Left ventricle: The cavity size was normal. Wall thickness was normal. Systolic function was normal. The estimated ejection fraction was in the range of 60% to 65%. Wall motion was normal; there were no regional wall motion abnormalities. Doppler parameters are consistent with abnormal left ventricular relaxation (grade 1 diastolic dysfunction). - Mitral valve: Transvalvular  velocity was within the normal range. There was no evidence for stenosis. There was no regurgitation. - Right ventricle: The cavity size was normal. Wall thickness was normal. Systolic function was normal. - Tricuspid valve: There was trivial regurgitation. - Pulmonary arteries: PA peak pressure: 40 mm Hg (S). - Inferior vena cava: The vessel was normal in size. The respirophasic diameter changes were blunted (< 50%), consistent with normal central venous pressure.  Renal artery [place] - Elevated velocities in the right renal artery are consistent with 60-99% stenosis. Left renal artery appeared within normal limits. Results given to patient's nurse.    PHYSICAL EXAM  Temp:  [98.4 F (36.9 C)-99.1 F (37.3 C)] 98.6 F (37 C) (10/02 1030) Pulse Rate:  [60-97] 81 (10/02 1130) Resp:  [14-41] 20 (10/02 1130) BP: (114-185)/(62-164) 173/95 mmHg (10/02 1130) SpO2:  [91 %-97 %] 95 % (10/02 1130)  General - obese, well developed, awake alert.  Ophthalmologic - Fundi not visualized due to noncooperation.  Cardiovascular - Regular rate and rhythm with no murmur.  Neuro - awake alert, able to follow all simple commands, with transcortical motor aphasia. nonfluent speech.PERRL, EOMI, blinking to visual threat on the both sides, right facial droop, RUE 0/5 and RLE 1/5  on pain stimulation, right side decreased sensation with pain stimulation, LUE and LLE spontaneous movement against gravity, babinski mute b/l, DTR 1+. No ataxia on the LUE.    ASSESSMENT/PLAN Ms. Katie Nichols is a 44 y.o. female with history of hypertension and asthma presenting with right-sided weakness and sensory deficits with blood pressure  280/180. She did not receive IV t-PA due to acute left basal ganglia hemorrhage.   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   Resultant  Right hemiplegia, transcortical motor  aphasia.  MRI / MRA not ordered yet   Repeat CT head showed stable hematoma and no hydrocephalus  UDS - positive for THC  Renal artery US showed right RAS  2D Echo unremarkable  LDL 109  HgbA1c 5.6  VTE prophylaxis subq heparin  DIET DYS 3 Room service appropriate?: Yes; Fluid consistency:: Nectar Thick  no antithrombotic prior to admission, now on no antithrombotic secondary to hemorrhage  Ongoing aggressive stroke risk factor management  Therapy recommendations: Pending  Disposition: Pending  Cerebral edema  Although not large volume ICH, however, medial thalamic ICH causing significant midline shift and uncal herniation  Repeat CT stable  taper off 3% saline  Na 136 -> 144 -> 147 -> 148 -> 150  Hypertension, malignant  On cleviprex and esmolol drip Plan to taper drips  Change BP goal < 180 On po meds with amlodipine, metoprolol, hydralazine, clonidine and spiranolactone Renal artery ultrasound showed right RAS Check renin, ADH and cathecholamines - pending Appreciate help from Renal service foe BP management  RAS  Ultrasound showed right RAS  VVS on board  Consider angiogram on Monday  Hyperlipidemia   Not on any home meds  LDL 109, goal < 70  Add lipitor    Continue statin on discharge.  Other Stroke Risk Factors  Obesity, Body mass index is 31.83 kg/(m^2).   Other Active Problems  Hypokalemia - on supplement  Hospital day # 6  This patient is critically ill due to left thalamic ICH with ventricular extension, uncontrolled BP, right RAS and at significant risk of neurological worsening, death form enlargement of hematoma, obstructive hydrocephalus, HTN emergency, heart failure, CHF, cerebral edma and brain herniation. 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 Check CT abdomen and pelvis for adrenal tumor   Delia Heady, MD Stroke Neurology 06/25/2015 11:41 AM   To contact Stroke Continuity provider, please refer to [email or web address]. After hours, contact General Neurology

## 2015-06-25 NOTE — Evaluation (Signed)
Physical Therapy Evaluation Patient Details Name: Katie Nichols MRN: [id number] DOB: Aug 08, 1971 Today's Date: 06/25/2015   History of Present Illness  Patient is a 44 yo female admitted 06/19/15 with acute onset of right sided weakness and sensory deficits.  Code stroke activated. On EMS arrival noted to have BP of 280/180 with HR in the 120s. Patient intubated in ED.  CT head imaging shows left basal ganglia ICH with minimal intra-ventricular extension with around 3mm midline shift. No signs of early hydrocephalus. Initial NIHSS of 12.  Renal US showed right RAS.  Patient extubated 06/21/15.  Clinical Impression  Patient presents with problems listed below.  Will benefit from acute PT to maximize functional mobility prior to discharge.  Recommend Inpatient Rehab consult to return patient to highest functional level with goal to return home with family.    Follow Up Recommendations CIR    Equipment Recommendations  Wheelchair (measurements PT);Wheelchair cushion (measurements PT);3in1 (PT)    Recommendations for Other Services Rehab consult     Precautions / Restrictions Precautions Precautions: Fall Precaution Comments: Rt hemiplegia; Vision deficits Restrictions Weight Bearing Restrictions: No      Mobility  Bed Mobility Overal bed mobility: Needs Assistance Bed Mobility: Rolling;Sidelying to Sit Rolling: Mod assist Sidelying to sit: Max assist       General bed mobility comments: Verbal and tactile cues for mobility   Assist to move RLE off of bed and raise trunk to sitting position.  Once upright, patient able to maintain static sitting balance with min guard assist x20 seconds.  Min assist for sitting balance.  Transfers Overall transfer level: Needs assistance Equipment used: 2 person hand held assist Transfers: Sit to/from [employer] Sit to Stand: Mod assist;+2 physical assistance Stand pivot transfers: Mod assist;+2 physical assistance        General transfer comment: Verbal cues for technique.  Assist to rise to standing and for balance once upright.  Patient able to shuffle on LLE to pivot to chair with +2 assist.  No weight bearing through RLE.  Ambulation/Gait                Stairs            Wheelchair Mobility    Modified Rankin (Stroke Patients Only) Modified Rankin (Stroke Patients Only) Pre-Morbid Rankin Score: No symptoms Modified Rankin: Severe disability     Balance Overall balance assessment: Needs assistance Sitting-balance support: Single extremity supported;Feet supported Sitting balance-Leahy Scale: Poor     Standing balance support: Single extremity supported Standing balance-Leahy Scale: Poor                               Pertinent Vitals/Pain Pain Assessment: No/denies pain  SBP 158 prior to session (goal < 180)    Home Living Family/patient expects to be discharged to:: Private residence Living Arrangements: Parent;Children (Mother and 21 yo son) Available Help at Discharge: Family;Available 24 hours/day Type of Home: House (Condo) Home Access: Level entry     Home Layout: One level Home Equipment: None      Prior Function Level of Independence: Independent               Hand Dominance   Dominant Hand: Right    Extremity/Trunk Assessment   Upper Extremity Assessment: Defer to OT evaluation;RUE deficits/detail RUE Deficits / Details: No movement noted RUE   RUE Sensation: decreased light touch     Lower Extremity Assessment:  RLE deficits/detail RLE Deficits / Details: No movement noted RLE       Communication   Communication: No difficulties (Low volume voice)  Cognition Arousal/Alertness: Lethargic Behavior During Therapy: Anxious;Flat affect;Restless Overall Cognitive Status: Impaired/Different from baseline Area of Impairment: Safety/judgement;Awareness;Problem solving         Safety/Judgement: Decreased awareness of  deficits;Decreased awareness of safety   Problem Solving: Slow processing;Difficulty sequencing;Requires verbal cues      General Comments      Exercises        Assessment/Plan    PT Assessment Patient needs continued PT services  PT Diagnosis Difficulty walking;Hemiplegia dominant side;Altered mental status   PT Problem List Decreased strength;Decreased activity tolerance;Decreased balance;Decreased mobility;Decreased cognition;Decreased knowledge of use of DME;Decreased safety awareness  PT Treatment Interventions DME instruction;Functional mobility training;Therapeutic activities;Balance training;Neuromuscular re-education;Cognitive remediation;Patient/family education   PT Goals (Current goals can be found in the Care Plan section) Acute Rehab PT Goals Patient Stated Goal: None stated PT Goal Formulation: With patient Time For Goal Achievement: 07/09/15 Potential to Achieve Goals: Good    Frequency Min 4X/week   Barriers to discharge        Co-evaluation               End of Session Equipment Utilized During Treatment: Gait belt Activity Tolerance: Patient limited by fatigue;Patient limited by lethargy Patient left: in chair;with call bell/phone within reach;with chair alarm set Nurse Communication: Mobility status         Time: 6045-4098 PT Time Calculation (min) (ACUTE ONLY): 21 min   Charges:   PT Evaluation $Initial PT Evaluation Tier I: 1 Procedure     PT G CodesVena Austria 2015-06-27, 4:44 PM Durenda Hurt. Renaldo Fiddler, [facility] Acute Rehab Services Pager [phone]

## 2015-06-26 ENCOUNTER — Encounter (HOSPITAL_COMMUNITY)
Admission: EM | Disposition: A | Discharge status: Skilled Nursing Facility | Payer: Self-pay | Source: Home / Self Care | Attending: Neurology

## 2015-06-26 DIAGNOSIS — I10 Essential (primary) hypertension: Secondary | ICD-10-CM | POA: Insufficient documentation

## 2015-06-26 LAB — RENAL FUNCTION PANEL
ALBUMIN: 3.2 g/dL — AB (ref 3.5–5.0)
Anion gap: 9 (ref 5–15)
BUN: 22 mg/dL — AB (ref 6–20)
CHLORIDE: 105 mmol/L (ref 101–111)
CO2: 25 mmol/L (ref 22–32)
CREATININE: 0.64 mg/dL (ref 0.44–1.00)
Calcium: 8.7 mg/dL — ABNORMAL LOW (ref 8.9–10.3)
Glucose, Bld: 127 mg/dL — ABNORMAL HIGH (ref 65–99)
PHOSPHORUS: 3.1 mg/dL (ref 2.5–4.6)
Potassium: 2.9 mmol/L — ABNORMAL LOW (ref 3.5–5.1)
Sodium: 139 mmol/L (ref 135–145)

## 2015-06-26 LAB — URINALYSIS, ROUTINE W REFLEX MICROSCOPIC
GLUCOSE, UA: NEGATIVE mg/dL
HGB URINE DIPSTICK: NEGATIVE
KETONES UR: 15 mg/dL — AB
Nitrite: NEGATIVE
PROTEIN: NEGATIVE mg/dL
Specific Gravity, Urine: 1.035 — ABNORMAL HIGH (ref 1.005–1.030)
UROBILINOGEN UA: 1 mg/dL (ref 0.0–1.0)
pH: 5.5 (ref 5.0–8.0)

## 2015-06-26 LAB — URINE MICROSCOPIC-ADD ON

## 2015-06-26 SURGERY — RENAL ANGIOGRAPHY

## 2015-06-26 MED ORDER — POTASSIUM CHLORIDE CRYS ER 20 MEQ PO TBCR
40.0000 meq | EXTENDED_RELEASE_TABLET | Freq: Once | ORAL | Status: AC
  Administered 2015-06-26: 40 meq via ORAL
  Filled 2015-06-26: qty 2

## 2015-06-26 MED ORDER — POTASSIUM CHLORIDE 10 MEQ/50ML IV SOLN
10.0000 meq | INTRAVENOUS | Status: AC
  Administered 2015-06-26 (×4): 10 meq via INTRAVENOUS
  Filled 2015-06-26 (×4): qty 50

## 2015-06-26 MED ORDER — FUROSEMIDE 80 MG PO TABS
80.0000 mg | ORAL_TABLET | Freq: Two times a day (BID) | ORAL | Status: DC
  Administered 2015-06-26 – 2015-06-29 (×6): 80 mg via ORAL
  Filled 2015-06-26 (×6): qty 1

## 2015-06-26 NOTE — Progress Notes (Signed)
Rehab Admissions Coordinator Note:  Patient was screened by Trish Mage for appropriateness for an Inpatient Acute Rehab Consult.  At this time, we are recommending Inpatient Rehab consult.  [name] M 06/26/2015, 8:45 AM  I can be reached at [phone].

## 2015-06-26 NOTE — Evaluation (Signed)
Occupational Therapy Evaluation Patient Details Name: Katie Nichols MRN: [id number] DOB: 07/25/1971 Today's Date: 06/26/2015    History of Present Illness Patient is a 44 yo female admitted 06/19/15 with acute onset of right sided weakness and sensory deficits.  Code stroke activated. On EMS arrival noted to have BP of 280/180 with HR in the 120s. Patient intubated in ED.  CT head imaging shows left basal ganglia ICH with minimal intra-ventricular extension with around 3mm midline shift. No signs of early hydrocephalus. Initial NIHSS of 12.  Renal US showed right RAS.  Patient extubated 06/21/15.   Clinical Impression   Pt was independent prior to admission.  Presents with dense R hemiparesis with sensory deficits, decreased balance, impaired vision, and decreased activity tolerance interfering with ability to perform ADL and mobility.  Pt currently requiring +2 assist.  Pt will require intensive rehab when medically stable, recommending CIR.  Will follow acutely.    Follow Up Recommendations  CIR;Supervision/Assistance - 24 hour    Equipment Recommendations   (to be determined in next venue)    Recommendations for Other Services       Precautions / Restrictions Precautions Precautions: Fall Precaution Comments: Rt hemiplegia; Vision deficits Restrictions Weight Bearing Restrictions: No      Mobility Bed Mobility        General bed mobility comments: pt in chair  Transfers Overall transfer level: Needs assistance Equipment used: 2 person hand held assist Transfers: Sit to/from Stand Sit to Stand: Mod assist;+2 physical assistance            Balance Overall balance assessment: Needs assistance Sitting-balance support: Single extremity supported;Feet supported Sitting balance-Leahy Scale: Poor Sitting balance - Comments: sat edge of chair with R UE supported with focus on trunk strength and balance Postural control: Right lateral lean                              ADL Overall ADL's : Needs assistance/impaired Eating/Feeding: Minimal assistance;Sitting Eating/Feeding Details (indicate cue type and reason): assist for set up, able to use L UE to self feed Grooming: Wash/dry hands;Wash/dry face;Moderate assistance;Sitting Grooming Details (indicate cue type and reason): decreased thoroughness/task persistence, in supportive sitting Upper Body Bathing: Maximal assistance;Sitting   Lower Body Bathing: Total assistance;+2 for physical assistance;Bed level   Upper Body Dressing : Maximal assistance;Sitting   Lower Body Dressing: +2 for physical assistance;Total assistance;Bed level               Functional mobility during ADLs:  (non ambulatory)       Vision Vision Assessment?: Yes Eye Alignment: Within Functional Limits Ocular Range of Motion: Restricted looking up Tracking/Visual Pursuits: Decreased smoothness of horizontal tracking (difficulty holding position of eyes) Convergence: Impaired - to be further tested in functional context Diplopia Assessment: Present in far gaze (not present all the time)   Perception     Praxis      Pertinent Vitals/Pain Pain Assessment: No/denies pain     Hand Dominance Right   Extremity/Trunk Assessment Upper Extremity Assessment Upper Extremity Assessment: RUE deficits/detail;LUE deficits/detail RUE Deficits / Details: flaccid, full PROM RUE Sensation: decreased light touch;decreased proprioception RUE Coordination: decreased gross motor;decreased fine motor LUE Deficits / Details: generalized weakness   Lower Extremity Assessment Lower Extremity Assessment: Defer to PT evaluation       Communication Communication Communication: No difficulties   Cognition Arousal/Alertness: Awake/alert Behavior During Therapy: Flat affect;Anxious Overall Cognitive Status: Impaired/Different from baseline  Area of Impairment: Safety/judgement;Awareness         Safety/Judgement: Decreased  awareness of deficits;Decreased awareness of safety Awareness: Emergent Problem Solving: Requires verbal cues;Requires tactile cues;Difficulty sequencing General Comments: Pt at one point made no sense when questioned about her bathroom set up, but was aware.   General Comments       Exercises       Shoulder Instructions      Home Living Family/patient expects to be discharged to:: Private residence Living Arrangements: Parent;Children (33 year old son) Available Help at Discharge: Family;Available 24 hours/day Type of Home: Other(Comment) (Condo) Home Access: Level entry     Home Layout: One level     Bathroom Shower/Tub: Chief Strategy Officer: Standard     Home Equipment: None          Prior Functioning/Environment Level of Independence: Independent             OT Diagnosis: Generalized weakness;Disturbance of vision;Cognitive deficits;Hemiplegia dominant side   OT Problem List: Decreased strength;Decreased activity tolerance;Decreased range of motion;Impaired balance (sitting and/or standing);Impaired vision/perception;Decreased coordination;Decreased cognition;Decreased safety awareness;Decreased knowledge of use of DME or AE;Impaired sensation;Impaired tone;Obesity;Impaired UE functional use   OT Treatment/Interventions: Self-care/ADL training;Neuromuscular education;DME and/or AE instruction;Therapeutic activities;Cognitive remediation/compensation;Visual/perceptual remediation/compensation;Patient/family education;Balance training    OT Goals(Current goals can be found in the care plan section) Acute Rehab OT Goals Patient Stated Goal: get stronger OT Goal Formulation: With patient Time For Goal Achievement: 07/10/15 Potential to Achieve Goals: Good ADL Goals Pt Will Perform Grooming: with min assist;sitting Pt Will Transfer to Toilet: with mod assist;stand pivot transfer;bedside commode Additional ADL Goal #1: Pt will sit EOB x 5 minutes  with supervision while engaged in ADL. Additional ADL Goal #2: Pt will position/protect R UE from injury in chair, bed and during mobility with minimal verbal cues. Additional ADL Goal #3: Pt will perform bed mobility with min assist in preparation for ADL and to assist with positioning.  OT Frequency: Min 3X/week   Barriers to D/C:            Co-evaluation              End of Session    Activity Tolerance: Patient limited by fatigue Patient left: in chair;with call bell/phone within reach;with chair alarm set   Time: 1610-9604 OT Time Calculation (min): 19 min Charges:  OT General Charges $OT Visit: 1 Procedure OT Evaluation $Initial OT Evaluation Tier I: 1 Procedure G-Codes:    Evern Bio 06/26/2015, 11:07 AM  [phone]

## 2015-06-26 NOTE — Progress Notes (Signed)
eLink Physician Progress Note and Electrolyte Replacement  Patient Name: Katie Nichols DOB: 09-Sep-1971 MRN: [id number]  Date of Service  06/26/2015   HPI/Events of Note    Recent Labs Lab 06/20/15 0400  06/21/15 0320  06/22/15 0235  06/23/15 0600  06/23/15 2104 06/24/15 0621  06/24/15 1550 06/24/15 2130 06/25/15 0215 06/25/15 1030 06/26/15 1250  NA 136  < > 144  < > 147*  < > 150*  < > 147* 142  < > 141 141 139 140 139  K 4.1  --  3.7  < > 2.8*  --  2.4*  --  2.8* 3.4*  --   --   --   --   --  2.9*  CL 105  --  115*  < > 110  --  112*  --  113* 108  --   --   --   --   --  105  CO2 23  --  23  < > 26  --  29  --  26 25  --   --   --   --   --  25  GLUCOSE 74  --  121*  < > 148*  --  134*  --  133* 129*  --   --   --   --   --  127*  BUN 21*  --  21*  < > 14  --  9  --  10 10  --   --   --   --   --  22*  CREATININE 1.03*  --  0.54  < > 0.52  --  0.49  --  0.45 0.44  --   --   --   --   --  0.64  CALCIUM 8.2*  --  8.3*  < > 9.3  --  8.9  --  8.8* 8.8*  --   --   --   --   --  8.7*  MG 1.9  --  2.0  --  2.1  --  1.8  --   --   --   --   --   --   --   --   --   PHOS 4.6  --  2.1*  --  <1.0*  --  1.7*  --   --   --   --   --   --   --   --  3.1  < > = values in this interval not displayed.  Estimated Creatinine Clearance: 98.9 mL/min (by C-G formula based on Cr of 0.64).  Intake/Output      10/02 0701 - 10/03 0700 10/03 0701 - 10/04 0700   P.O. 120    I.V. (mL/kg) 39 (0.4)    Total Intake(mL/kg) 159 (1.8)    Urine (mL/kg/hr) 2095 (1) 235 (0.2)   Stool     Total Output 2095 235   Net -1936 -235         - I/O DETAILED x 24h    Total I/O In: -  Out: 235 [Urine:235] - I/O THIS SHIFT    ASSESSMENT Low K  eICURN Interventions  KCL IV + 40 po   ASSESSMENT: MAJOR ELECTROLYTE      Dr. Kalman Shan, M.D., [facility].C.P Pulmonary and Critical Care Medicine Staff Physician [facility] [place] [facility] Pager: [phone], If no answer  or between  15:00h - 7:00h: call [phone]  06/26/2015 6:06 PM

## 2015-06-26 NOTE — Consult Note (Signed)
Physical Medicine and Rehabilitation Consult Reason for Consult: Acute left thalamic hemorrhagic infarct Referring Physician: Dr. Pearlean Brownie   HPI: Katie Nichols is a 44 y.o. right handed female with history of asthma, hypertension. Presented 06/19/2015 with right sided weakness. Patient lives with mother and 1 year old son and mother and independent prior to admission. She states she will have 24/7 support on discharge.  She lives in a home with 0 steps to enter.  Blood pressure 280/180 with heart rate 120s. Patient on no scheduled antihypertensives medication on admission or antiplatelets medication. 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 No early hydrocephalus 3 mm left to right shift. Patient initially intubated for airway protection. Neurology follow-up with conservative care. A renal duplex revealed right renal artery stenosis 60-99%. Vascular surgery consulted and await plan of care question plan renal angiogram. Will also need to see improvement in RR and stabilization of BP.  Echocardiogram with ejection fraction of 65% grade 1 diastolic dysfunction. CT abdomen and pelvis with no evidence of adrenal mass. Follow-up cranial CT scan 06/23/2015 with evolving left thalamus intraparenchymal hematoma, local mass effect no hydrocephalus. Mechanical soft nectar thick liquid diet. Subcutaneous heparin initiated for DVT prophylaxis 06/23/2015. Physical therapy evaluation completed 06/25/2015 with recommendations of physical medicine rehabilitation consult.   Review of Systems  Constitutional: Negative for fever and chills.  Eyes: Positive for double vision. Negative for blurred vision.  Respiratory: Negative for cough and shortness of breath.   Cardiovascular: Negative for chest pain, palpitations and leg swelling.  Gastrointestinal: Positive for constipation. Negative for nausea and vomiting.  Genitourinary: Negative for hematuria.   Retention  Musculoskeletal: Negative for myalgias and joint pain.  Skin: Negative for rash.  Neurological: Positive for headaches. Negative for seizures and loss of consciousness.  All other systems reviewed and are negative.  Past Medical History  Diagnosis Date  . Hypertension    No past surgical history on file. Family History  Problem Relation Age of Onset  . Heart failure Mother   . Diabetes Father   . Cancer Father   . Hypertension Father   . Coronary artery disease Father     hx CABG x 6   Social History:  has no tobacco, alcohol, and drug history on file. Allergies:  Allergies  Allergen Reactions  . Other     tomatoe   Medications Prior to Admission  Medication Sig Dispense Refill  . [DISCONTINUED] albuterol (PROVENTIL HFA;VENTOLIN HFA) 108 (90 BASE) MCG/ACT inhaler Inhale 2 puffs into the lungs every 6 (six) hours as needed for wheezing or shortness of breath.    . [DISCONTINUED] Aspirin-Acetaminophen-Caffeine (GOODY HEADACHE PO) Take 1 packet by mouth daily.      Home: Home Living Family/patient expects to be discharged to:: Private residence Living Arrangements: Parent, Children (68 year old son) Available Help at Discharge: Family, Available 24 hours/day Type of Home: Other(Comment) (Condo) Home Access: Level entry Home Layout: One level Bathroom Shower/Tub: Engineer, manufacturing systems: Standard Home Equipment: None  Functional History: Prior Function Level of Independence: Independent Functional Status:  Mobility: Bed Mobility Overal bed mobility: Needs Assistance Bed Mobility: Rolling, Sidelying to Sit Rolling: Min assist Sidelying to sit: Mod assist General bed mobility comments: pt in chair Transfers Overall transfer level: Needs assistance Equipment used: 2 person hand held assist Transfers: Sit to/from Stand, [employer] Transfers Sit to Stand: Mod assist, +2 physical assistance Stand pivot transfers: Mod assist, +2 physical  assistance Squat pivot  transfers: Mod assist, Max assist General transfer comment: assist for right side activation in hip and knee extensors and for left weight shift; assist to chair pivot to right side after positioned for transferand cues right leg buckling and sat on edge of left side of chair, assist scooting over and back in chair with cues and time due to pt impulsively pushing through left LE and falling to right. Ambulation/Gait General Gait Details: unable to ambulate, pregait with standing weight shifts and cues for activation of right side in standing    ADL: ADL Overall ADL's : Needs assistance/impaired Eating/Feeding: Minimal assistance, Sitting Eating/Feeding Details (indicate cue type and reason): assist for set up, able to use L UE to self feed Grooming: Wash/dry hands, Wash/dry face, Moderate assistance, Sitting Grooming Details (indicate cue type and reason): decreased thoroughness/task persistence, in supportive sitting Upper Body Bathing: Maximal assistance, Sitting Lower Body Bathing: Total assistance, +2 for physical assistance, Bed level Upper Body Dressing : Maximal assistance, Sitting Lower Body Dressing: +2 for physical assistance, Total assistance, Bed level Functional mobility during ADLs:  (non ambulatory)  Cognition: Cognition Overall Cognitive Status: Impaired/Different from baseline Arousal/Alertness: Lethargic Orientation Level: Oriented X4 Cognition Arousal/Alertness: Awake/alert Behavior During Therapy: Flat affect, Anxious Overall Cognitive Status: Impaired/Different from baseline Area of Impairment: Safety/judgement, Awareness Safety/Judgement: Decreased awareness of deficits, Decreased awareness of safety Awareness: Emergent Problem Solving: Requires verbal cues, Requires tactile cues, Difficulty sequencing General Comments: Pt at one point made no sense when questioned about her bathroom set up, but was aware.  Blood pressure 130/80, pulse  62, temperature 98.4 F (36.9 C), temperature source Oral, resp. rate 18, height  (1.676 m), weight 89.4 kg (197 lb 1.5 oz), last menstrual period 06/23/2015, SpO2 94 %. Physical Exam  Vitals reviewed. Constitutional: She appears well-developed and well-nourished.  HENT:  Head: Normocephalic and atraumatic.  Eyes: Conjunctivae are normal. Right eye exhibits no discharge. Left eye exhibits no discharge.  Pupils are pinpoint but reactive without nystagmus  Neck: Normal range of motion. Neck supple. No thyromegaly present.  Cardiovascular: Normal rate and regular rhythm.   Respiratory: Effort normal and breath sounds normal. No respiratory distress.  GI: Soft. Bowel sounds are normal. She exhibits no distension.  Musculoskeletal: She exhibits no edema or tenderness.  Normal PROM RUE, RLE flaccid  Neurological: She is alert.  Alert with flat affect. Makes eye contact with examiner. She can provide her name and age but needed some cues for place. Follows simple commands. Fair awareness of deficits. Word finding difficulty.  ?Vertical gaze palsy.  Poor attention/concentration.   Right sided tongue deviation, absent shoulder shrug right. Sensation intact to light touch  Skin: Skin is warm and dry.    No results found for this or any previous visit (from the past 24 hour(s)). Ct Abdomen Pelvis W Contrast  06/25/2015   CLINICAL DATA:  44 year old female with uncontrolled secondary/malignant hypertension. Evaluate for adrenal mass or pheochromocytoma.  EXAM: CT ABDOMEN AND PELVIS WITH CONTRAST  TECHNIQUE: Multidetector CT imaging of the abdomen and pelvis was performed using the standard protocol following bolus administration of intravenous contrast.  CONTRAST:  OMNIPAQUE IOHEXOL 300 MG/ML  SOLN  COMPARISON:  None.  FINDINGS: Lower chest:  Minimal bibasilar atelectasis noted.  Hepatobiliary: The liver and gallbladder are unremarkable. There is no evidence of biliary dilatation.  Pancreas:  Unremarkable  Spleen: Unremarkable  Adrenals/Urinary Tract: Minimal perinephric fluid bilaterally noted. The kidneys are otherwise unremarkable.  The adrenal glands are unremarkable without evidence of  adrenal mass. There is no evidence of soft tissue mass along the sympathetic chain or bladder.  Stomach/Bowel: Unremarkable. There is no evidence of bowel obstruction or focal bowel wall thickening. The appendix is normal.  Vascular/Lymphatic: No enlarged lymph nodes or abdominal aortic aneurysm. Aortic atherosclerotic calcifications are noted.  Reproductive: The uterus and adnexal regions are unremarkable.  Other: No free fluid, abscess or pneumoperitoneum.  Musculoskeletal: No acute or suspicious abnormality.  IMPRESSION: No evidence of adrenal mass or pheochromocytoma within the abdomen or pelvis. If there is strong clinical concern, recommend nuclear medicine study for further evaluation.  Minimal perinephric fluid bilaterally which is nonspecific but may be related to renal inflammation.   Electronically Signed   By: Harmon Pier M.D.   On: 06/25/2015 14:49    Assessment/Plan: Diagnosis: Left basal ganglia hemorrhage 1. Does the need for close, 24 hr/day medical supervision in concert with the patient's rehab needs make it unreasonable for this patient to be served in a less intensive setting? Yes 2. Co-Morbidities requiring supervision/potential complications: HTN, tachypnea 3. Due to bladder management, safety, disease management, medication administration and patient education, does the patient require 24 hr/day rehab nursing? Yes 4. Does the patient require coordinated care of a physician, rehab nurse, PT (1-2 hrs/day, 5 days/week), OT (1-2 hrs/day, 5 days/week) and SLP (1-2 hrs/day, 5 days/week) to address physical and functional deficits in the context of the above medical diagnosis(es)? Yes Addressing deficits in the following areas: balance, endurance, locomotion, strength, transferring,  bowel/bladder control, bathing, dressing, feeding, grooming, toileting, cognition, speech, language, swallowing and psychosocial support 5. Can the patient actively participate in an intensive therapy program of at least 3 hrs of therapy per day at least 5 days per week? Potentially 6. The potential for patient to make measurable gains while on inpatient rehab is good 7. Anticipated functional outcomes upon discharge from inpatient rehab are min assist and mod assist  with PT, min assist and mod assist with OT, supervision and min assist with SLP. 8. Estimated rehab length of stay to reach the above functional goals is: 14-17 days 9. Does the patient have adequate social supports and living environment to accommodate these discharge functional goals? Potentially 10. Anticipated D/C setting: Home 11. Anticipated post D/C treatments: HH therapy and Home excercise program 12. Overall Rehab/Functional Prognosis: good  RECOMMENDATIONS: This patient's condition is appropriate for continued rehabilitative care in the following setting: CIR Patient has agreed to participate in recommended program. Yes Note that insurance prior authorization may be required for reimbursement for recommended care.  Comment: Rehab Admissions Coordinator to follow up.  06/26/2015

## 2015-06-26 NOTE — Progress Notes (Addendum)
Pt arrived to 5M08 at 2040. Alert and oriented x3-4. Denies any pain. Oriented to room. Will continue to monitor.

## 2015-06-26 NOTE — Progress Notes (Signed)
Subjective: Interval History: has no complaint .  Objective: Vital signs in last 24 hours: Temp:  [97.3 F (36.3 C)-98.8 F (37.1 C)] 98.4 F (36.9 C) (10/03 1120) Pulse Rate:  [57-89] 69 (10/03 1100) Resp:  [15-46] 31 (10/03 1100) BP: (130-214)/(77-115) 163/82 mmHg (10/03 1100) SpO2:  [92 %-100 %] 93 % (10/03 1100) Weight change:   Intake/Output from previous day: 10/02 0701 - 10/03 0700 In: 159 [P.O.:120; I.V.:39] Out: 2095 [Urine:2095] Intake/Output this shift:    General appearance: cooperative, pale and slowed mentation Resp: diminished breath sounds bilaterally Cardio: S1, S2 normal, S4 present and systolic murmur: holosystolic 2/6, blowing at apex GI: pos bs, soft, nontender Extremities: edema tr-1+  Lab Results:  Recent Labs  06/24/15 0621  WBC 7.6  HGB 14.9  HCT 44.8  PLT 229   BMET:  Recent Labs  06/23/15 2104 06/24/15 0621  06/25/15 0215 06/25/15 1030  NA 147* 142  < > 139 140  K 2.8* 3.4*  --   --   --   CL 113* 108  --   --   --   CO2 26 25  --   --   --   GLUCOSE 133* 129*  --   --   --   BUN 10 10  --   --   --   CREATININE 0.45 0.44  --   --   --   CALCIUM 8.8* 8.8*  --   --   --   < > = values in this interval not displayed. No results for input(s): PTH in the last 72 hours. Iron Studies: No results for input(s): IRON, TIBC, TRANSFERRIN, FERRITIN in the last 72 hours.  Studies/Results: Ct Abdomen Pelvis W Contrast  06/25/2015   CLINICAL DATA:  44 year old female with uncontrolled secondary/malignant hypertension. Evaluate for adrenal mass or pheochromocytoma.  EXAM: CT ABDOMEN AND PELVIS WITH CONTRAST  TECHNIQUE: Multidetector CT imaging of the abdomen and pelvis was performed using the standard protocol following bolus administration of intravenous contrast.  CONTRAST:  OMNIPAQUE IOHEXOL 300 MG/ML  SOLN  COMPARISON:  None.  FINDINGS: Lower chest:  Minimal bibasilar atelectasis noted.  Hepatobiliary: The liver and gallbladder are  unremarkable. There is no evidence of biliary dilatation.  Pancreas: Unremarkable  Spleen: Unremarkable  Adrenals/Urinary Tract: Minimal perinephric fluid bilaterally noted. The kidneys are otherwise unremarkable.  The adrenal glands are unremarkable without evidence of adrenal mass. There is no evidence of soft tissue mass along the sympathetic chain or bladder.  Stomach/Bowel: Unremarkable. There is no evidence of bowel obstruction or focal bowel wall thickening. The appendix is normal.  Vascular/Lymphatic: No enlarged lymph nodes or abdominal aortic aneurysm. Aortic atherosclerotic calcifications are noted.  Reproductive: The uterus and adnexal regions are unremarkable.  Other: No free fluid, abscess or pneumoperitoneum.  Musculoskeletal: No acute or suspicious abnormality.  IMPRESSION: No evidence of adrenal mass or pheochromocytoma within the abdomen or pelvis. If there is strong clinical concern, recommend nuclear medicine study for further evaluation.  Minimal perinephric fluid bilaterally which is nonspecific but may be related to renal inflammation.   Electronically Signed   By: Harmon Pier M.D.   On: 06/25/2015 14:49    I have reviewed the patient's current medications.  Assessment/Plan: 1 Severe HTN needs reg po diuretics.  Will need to gradually ^ ACEI.  BP improving 2 ICB with R hemiparesis 3 ?RAS not emergent issue P Lasix, change labetolol to Coreg tomorrow, check K    LOS: 7 days  Rojelio Uhrich L 06/26/2015,11:50 AM

## 2015-06-26 NOTE — Progress Notes (Signed)
   Daily Progress Note  Events over the weekend reviewed.  Re-consult once you feel the patient is stable enough to proceed with the R renal angiogram.  Leonides Sake, MD [facility]: [phone] Pager: [phone]  06/26/2015, 9:28 AM

## 2015-06-26 NOTE — Progress Notes (Signed)
I briefly met with pt and then with her permission contacted her Mom, Mardene Celeste, by phone. Mom and pt live together with pt's 46 yo son. Mom does not drive and she states is unable to provide any physical assistance for she is a heart pt per Mom and uses a cane. Mom is having a difficult time trying to understand how she will assist pt at home. I will discuss with SW and follow up tomorrow. [phone]

## 2015-06-26 NOTE — Progress Notes (Signed)
Speech Language Pathology Treatment: Dysphagia;Cognitive-Linquistic  Patient Details Name: Katie Nichols MRN: [id number] DOB: 1971/06/14 Today's Date: 06/26/2015 Time: 1500-1520 SLP Time Calculation (min) (ACUTE ONLY): 20 min  Assessment / Plan / Recommendation Clinical Impression  Pt participatory; speech is fluent with improved word-retrieval and improved recognition of paraphasic errors.   Pt able to convey basic needs and ideas - mod cues needed for compensatory strategies for word finding.  Pt stated that she is depressed; that her "eyes are crazy" due to double vision.  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   Tolerates nectar-thick liquids well and is ready for repeat MBS to determine ability to tolerate advanced diet.  Pt verbalizes agreement and optimism that she can drink thin liquids.  Recommend continued SLP f/u for aphasia and dysphagia.    HPI Other Pertinent Information: Katie Nichols is a 44 y.o. female hx of HTN, asthma presenting with SOB and code stroke with acute onset of right sided weakness and sensory deficits. CT head imaging reviewed shows left basal ganglia ICH with minimal intra-ventricular extension with around 3mm midline shift. Intubated 9/26-9/28.    Pertinent Vitals    SLP Plan  Continue with current plan of care    Recommendations Diet recommendations: Dysphagia 3 (mechanical soft);Nectar-thick liquid Liquids provided via: Cup;No straw Medication Administration: Whole meds with puree Supervision: Patient able to self feed;Full supervision/cueing for compensatory strategies Compensations: Slow rate;Small sips/bites;Check for pocketing Postural Changes and/or Swallow Maneuvers: Seated upright 90 degrees              Oral Care Recommendations: Oral care BID Follow up Recommendations: Inpatient Rehab Plan: Continue with current plan of care    Katie Nichols, [place]  CCC/SLP Pager [phone]      Katie Nichols 06/26/2015, 3:31 PM

## 2015-06-26 NOTE — Progress Notes (Signed)
STROKE TEAM PROGRESS NOTE   SUBJECTIVE (INTERVAL HISTORY) No family is at the bedside. Neuro stable overnight. Blood pressure  better controlled after oral medications  now off esmolol  and cleviprex drips. OBJECTIVE Temp:  [97.3 F (36.3 C)-98.8 F (37.1 C)] 98.4 F (36.9 C) (10/03 1120) Pulse Rate:  [57-89] 69 (10/03 1100) Cardiac Rhythm:  [-] Normal sinus rhythm (10/03 0800) Resp:  [15-46] 31 (10/03 1100) BP: (130-214)/(77-115) 163/82 mmHg (10/03 1100) SpO2:  [92 %-100 %] 93 % (10/03 1100)  CBC:  Recent Labs Lab 06/19/15 1918  06/23/15 0600 06/24/15 0621  WBC 6.8  < > 8.8 7.6  NEUTROABS 4.3  --   --  5.7  HGB 14.7  < > 14.6 14.9  HCT 43.1  < > 44.0 44.8  MCV 86.2  < > 87.6 87.2  PLT 199  < > 222 229  < > = values in this interval not displayed.  Basic Metabolic Panel:  Recent Labs Lab 06/22/15 0235  06/23/15 0600  06/23/15 2104 06/24/15 0621  06/25/15 0215 06/25/15 1030  NA 147*  < > 150*  < > 147* 142  < > 139 140  K 2.8*  --  2.4*  --  2.8* 3.4*  --   --   --   CL 110  --  112*  --  113* 108  --   --   --   CO2 26  --  29  --  26 25  --   --   --   GLUCOSE 148*  --  134*  --  133* 129*  --   --   --   BUN 14  --  9  --  10 10  --   --   --   CREATININE 0.52  --  0.49  --  0.45 0.44  --   --   --   CALCIUM 9.3  --  8.9  --  8.8* 8.8*  --   --   --   MG 2.1  --  1.8  --   --   --   --   --   --   PHOS <1.0*  --  1.7*  --   --   --   --   --   --   < > = values in this interval not displayed.  Lipid Panel:     Component Value Date/Time   CHOL 176 06/20/2015 2120   TRIG 69 06/20/2015 2120   HDL 53 06/20/2015 2120   CHOLHDL 3.3 06/20/2015 2120   VLDL 14 06/20/2015 2120   LDLCALC 109* 06/20/2015 2120   HgbA1c:  Lab Results  Component Value Date   HGBA1C 5.6 06/19/2015   Urine Drug Screen:     Component Value Date/Time   LABOPIA NONE DETECTED 06/21/2015 1233   COCAINSCRNUR NONE DETECTED 06/21/2015 1233   LABBENZ NONE DETECTED 06/21/2015 1233    AMPHETMU NONE DETECTED 06/21/2015 1233   THCU POSITIVE* 06/21/2015 1233   LABBARB NONE DETECTED 06/21/2015 1233      IMAGING I have personally reviewed the radiological images below and agree with the radiology interpretations. Blue text is my interpretation.  Ct Head Wo Contrast  06/23/2015   IMPRESSION: Evolving LEFT thalamus intraparenchymal hematoma, local mass-effect.  Similar intraventricular blood products, mild hydrocephalus. Mild global parenchymal edema.    06/21/2015   IMPRESSION: 1. Stable size of left thalamic hematoma with similar localized vasogenic edema. 2. Overall slight interval decrease in  intraventricular hemorrhage with stable mild dilatation of the temporal horns. 3. Stable 6 mm left-to-right shift at the level of the hematoma.   06/20/2015    1. Unchanged left thalamic hematoma.  2. Newly seen right lateral ventricle and interpeduncular fossa hemorrhage is likely redistribution. Overall, intraventricular hematoma volume is similar to yesterday. However, more prominent midline shift comparing with last CT head. 3. Stable mild dilation of the temporal horns.     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 No early hydrocephalus. 3 mm of LEFT-to- RIGHT midline shift.    Dg Chest Port 1 View 06/19/2015    1. Endotracheal tube tip 26 mm from the carina, in good position.  2. Enteric tube tip not visible.  3. No focal consolidation. Suggestion of pulmonary vascular congestion.     2D ehco - - Left ventricle: The cavity size was normal. Wall thickness was normal. Systolic function was normal. The estimated ejection fraction was in the range of 60% to 65%. Wall motion was normal; there were no regional wall motion abnormalities. Doppler parameters are consistent with abnormal left ventricular relaxation (grade 1 diastolic dysfunction). - Mitral valve: Transvalvular velocity was within the normal range. There was no  evidence for stenosis. There was no regurgitation. - Right ventricle: The cavity size was normal. Wall thickness was normal. Systolic function was normal. - Tricuspid valve: There was trivial regurgitation. - Pulmonary arteries: PA peak pressure: 40 mm Hg (S). - Inferior vena cava: The vessel was normal in size. The respirophasic diameter changes were blunted (< 50%), consistent with normal central venous pressure.  Renal artery [place] - Elevated velocities in the right renal artery are consistent with 60-99% stenosis. Left renal artery appeared within normal limits. Results given to patient's nurse.    PHYSICAL EXAM  Temp:  [97.3 F (36.3 C)-98.8 F (37.1 C)] 98.4 F (36.9 C) (10/03 1120) Pulse Rate:  [57-89] 69 (10/03 1100) Resp:  [15-46] 31 (10/03 1100) BP: (130-214)/(77-115) 163/82 mmHg (10/03 1100) SpO2:  [92 %-100 %] 93 % (10/03 1100)  General - obese, well developed, awake alert.  Ophthalmologic - Fundi not visualized due to noncooperation.  Cardiovascular - Regular rate and rhythm with no murmur.  Neuro - awake alert, able to follow all simple commands, with  Mild transcortical motor aphasia. nonfluent speech.PERRL, EOMI, blinking to visual threat on the both sides, right facial droop, RUE 0/5 and RLE 1/5  on pain stimulation, right side decreased sensation with pain stimulation, LUE and LLE spontaneous movement against gravity, babinski mute b/l, DTR 1+. No ataxia on the LUE.    ASSESSMENT/PLAN Katie Nichols is a 44 y.o. female with history of hypertension and asthma presenting with right-sided weakness and sensory deficits with blood pressure  280/180. She did not receive IV t-PA due to acute left basal ganglia hemorrhage.   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   Resultant  Right hemiplegia, transcortical motor aphasia.  MRI / MRA not ordered yet    Repeat CT head showed stable hematoma and no hydrocephalus  UDS - positive for THC  Renal artery US showed right RAS  2D Echo unremarkable  LDL 109  HgbA1c 5.6  VTE prophylaxis subq heparin  DIET DYS 3 Room service appropriate?: Yes; Fluid consistency:: Nectar Thick  no antithrombotic prior to admission, now on no antithrombotic secondary to hemorrhage  Ongoing aggressive stroke risk  factor management  Therapy recommendations: CLR  Disposition: Pending  Cerebral edema  Although not large volume ICH, however, medial thalamic ICH causing significant midline shift and uncal herniation  Repeat CT stable  taper off 3% saline  Na 136 -> 144 -> 147 -> 148 -> 150  Hypertension, malignant  On cleviprex and esmolol drip Plan to taper drips  Change BP goal < 180 On po meds with amlodipine, metoprolol, hydralazine, clonidine and spiranolactone Renal artery ultrasound showed right RAS Aldosterone, Aldo/PRA ratio normal Appreciate help from Renal service for BP management  RAS  Ultrasound showed right RAS  VVS on board  Consider angiogram on Monday  Hyperlipidemia   Not on any home meds  LDL 109, goal < 70  Add lipitor    Continue statin on discharge.  Other Stroke Risk Factors  Obesity, Body mass index is 31.83 kg/(m^2).   Other Active Problems  Hypokalemia - on supplement  Hospital day # 7  Patient is neurologically stable and now off BP drips hence plan to transfer to floor bed today and continue oral BP meds. Appreciate renal service help with BP mangement. Plan to transfer to inpatient rehab over next few days and renal angiogram/angioplasty towards end of rehab stay Delia Heady, MD Stroke Neurology 06/26/2015 12:07 PM   To contact Stroke Continuity provider, please refer to [email or web address]. After hours, contact General Neurology

## 2015-06-26 NOTE — Progress Notes (Signed)
Physical Therapy Treatment Patient Details Name: Katie Nichols MRN: [id number] DOB: [birth year] Today's Date: 06/26/2015    History of Present Illness Patient is a 44 yo female admitted 06/19/15 with acute onset of right sided weakness and sensory deficits.  Code stroke activated. On EMS arrival noted to have BP of 280/180 with HR in the 120s. Patient intubated in ED.  CT head imaging shows left basal ganglia ICH with minimal intra-ventricular extension with around 3mm midline shift. No signs of early hydrocephalus. Initial NIHSS of 12.  Renal US showed right RAS.  Patient extubated 06/21/15.    PT Comments    Patient progressing with mobility and right side weight bearing and awareness.  Still limited by visual issues and noted drop on BP with standing from 170'2 to 130's systolic and pt symptomatic with lightheadedness.  Feel continued skilled interdisciplinary care will progress patient towards goals, so continue to recommend CIR level rehab at d/c.  Follow Up Recommendations  CIR     Equipment Recommendations  Other (comment) (TBA)    Recommendations for Other Services Rehab consult     Precautions / Restrictions Precautions Precautions: Fall Precaution Comments: Rt hemiplegia; Vision deficits Restrictions Weight Bearing Restrictions: No    Mobility  Bed Mobility Overal bed mobility: Needs Assistance   Rolling: Min assist Sidelying to sit: Mod assist          Transfers Overall transfer level: Needs assistance Equipment used: 2 person hand held assist Transfers: Sit to/from [employer] Sit to Stand: Mod assist;+2 physical assistance   Squat pivot transfers: Mod assist;Max assist     General transfer comment: assist for right side activation in hip and knee extensors and for left weight shift; assist to chair pivot to right side after positioned for transferand cues right leg buckling and sat on edge of left side of chair, assist scooting over and back  in chair with cues and time due to pt impulsively pushing through left LE and falling to right.  Ambulation/Gait             General Gait Details: unable to ambulate, pregait with standing weight shifts and cues for activation of right side in standing   Stairs            Wheelchair Mobility    Modified Rankin (Stroke Patients Only) Modified Rankin (Stroke Patients Only) Pre-Morbid Rankin Score: No symptoms Modified Rankin: Severe disability     Balance Overall balance assessment: Needs assistance Sitting-balance support: Single extremity supported;Feet supported Sitting balance-Leahy Scale: Poor Sitting balance - Comments: sitting edge of bed about 6 minutes focus on trunk mobility and visual fixation for balance, placement of right UE and LE for balance Postural control: Right lateral lean Standing balance support: Bilateral upper extremity supported Standing balance-Leahy Scale: Poor Standing balance comment: mod to max assist +2 standing about 1 minute for lateral weight shifts with cues and assist for right UE/LE and trunk activation.                    Cognition Arousal/Alertness: Awake/alert Behavior During Therapy: Anxious             Safety/Judgement: Decreased awareness of deficits;Decreased awareness of safety   Problem Solving: Requires verbal cues;Requires tactile cues;Difficulty sequencing      Exercises General Exercises - Lower Extremity Ankle Circles/Pumps: PROM;Right;5 reps;Supine (with passive PF stretch) Heel Slides: PROM;Right;5 reps;Supine    General Comments General comments (skin integrity, edema, etc.): placed patch over right eye  briefly, but no improvement in vision.  Gaze seems congugate but reports diplopia in all fields, noted some reflexive movement into covergence, then briskly back out with pupillary fluctuations as well.      Pertinent Vitals/Pain Pain Assessment: No/denies pain (reports headache overnight and  poor sleep)    Home Living                      Prior Function            PT Goals (current goals can now be found in the care plan section) Progress towards PT goals: Progressing toward goals    Frequency  Min 4X/week    PT Plan Current plan remains appropriate    Co-evaluation             End of Session Equipment Utilized During Treatment: Gait belt Activity Tolerance: Patient limited by fatigue Patient left: in chair;with call bell/phone within reach;with chair alarm set     Time: 8119-1478 PT Time Calculation (min) (ACUTE ONLY): 28 min  Charges:  $Therapeutic Exercise: 8-22 mins $Therapeutic Activity: 8-22 mins                    G Codes:      WYNN,CYNDI June 30, 2015, 10:14 AM  Sheran Lawless, PT [phone] 2015-06-30

## 2015-06-27 ENCOUNTER — Inpatient Hospital Stay (HOSPITAL_COMMUNITY): Payer: Medicaid Other

## 2015-06-27 DIAGNOSIS — G8191 Hemiplegia, unspecified affecting right dominant side: Secondary | ICD-10-CM

## 2015-06-27 LAB — RENAL FUNCTION PANEL
ALBUMIN: 3.3 g/dL — AB (ref 3.5–5.0)
ALBUMIN: 3.3 g/dL — AB (ref 3.5–5.0)
ANION GAP: 9 (ref 5–15)
ANION GAP: 8 (ref 5–15)
BUN: 24 mg/dL — ABNORMAL HIGH (ref 6–20)
BUN: 24 mg/dL — ABNORMAL HIGH (ref 6–20)
CO2: 25 mmol/L (ref 22–32)
CO2: 25 mmol/L (ref 22–32)
Calcium: 8.5 mg/dL — ABNORMAL LOW (ref 8.9–10.3)
Calcium: 8.9 mg/dL (ref 8.9–10.3)
Chloride: 104 mmol/L (ref 101–111)
Chloride: 106 mmol/L (ref 101–111)
Creatinine, Ser: 0.73 mg/dL (ref 0.44–1.00)
Creatinine, Ser: 0.65 mg/dL (ref 0.44–1.00)
GFR calc Af Amer: >60 mL/min (ref >60)
GFR calc Af Amer: >60 mL/min (ref >60)
Glucose, Bld: 123 mg/dL — ABNORMAL HIGH (ref 65–99)
Glucose, Bld: 137 mg/dL — ABNORMAL HIGH (ref 65–99)
PHOSPHORUS: 2.6 mg/dL (ref 2.5–4.6)
PHOSPHORUS: 2.1 mg/dL — AB (ref 2.5–4.6)
POTASSIUM: 3.4 mmol/L — AB (ref 3.5–5.1)
POTASSIUM: 3.6 mmol/L (ref 3.5–5.1)
Sodium: 138 mmol/L (ref 135–145)
Sodium: 139 mmol/L (ref 135–145)

## 2015-06-27 LAB — METANEPHRINES, PLASMA
Metanephrine, Free: 39 pg/mL (ref 0–62)
NORMETANEPHRINE FREE: 147 pg/mL — AB (ref 0–145)

## 2015-06-27 LAB — CATECHOLAMINES,UR.,FREE,24 HR
Dopamine, Rand Ur: 133 ug/L
Dopamine, Ur, 24Hr: 399 ug/24 hr (ref 0–510)
Epinephrine, Rand Ur: 17 ug/L
Epinephrine, U, 24Hr: 51 ug/24 hr — ABNORMAL HIGH (ref 0–20)
NOREPINEPH RAND UR: 104 ug/L
Norepinephrine,U,24H: 312 ug/24 hr — ABNORMAL HIGH (ref 0–135)
TOTAL VOLUME: 3000

## 2015-06-27 LAB — CBC
HEMATOCRIT: 43.3 % (ref 36.0–46.0)
HEMOGLOBIN: 14 g/dL (ref 12.0–15.0)
MCH: 28.2 pg (ref 26.0–34.0)
MCHC: 32.3 g/dL (ref 30.0–36.0)
MCV: 87.3 fL (ref 78.0–100.0)
Platelets: 195 10*3/uL (ref 150–400)
RBC: 4.96 MIL/uL (ref 3.87–5.11)
RDW: 14 % (ref 11.5–15.5)
WBC: 6.2 10*3/uL (ref 4.0–10.5)

## 2015-06-27 MED ORDER — CARVEDILOL 12.5 MG PO TABS
12.5000 mg | ORAL_TABLET | Freq: Two times a day (BID) | ORAL | Status: DC
  Administered 2015-06-27 – 2015-06-29 (×4): 12.5 mg via ORAL
  Filled 2015-06-27: qty 1
  Filled 2015-06-27: qty 2
  Filled 2015-06-27 (×2): qty 1

## 2015-06-27 MED ORDER — ENSURE ENLIVE PO LIQD
237.0000 mL | Freq: Three times a day (TID) | ORAL | Status: DC
  Administered 2015-06-27 – 2015-06-29 (×5): 237 mL via ORAL
  Filled 2015-06-27 (×9): qty 237

## 2015-06-27 MED ORDER — LISINOPRIL 20 MG PO TABS
20.0000 mg | ORAL_TABLET | Freq: Two times a day (BID) | ORAL | Status: DC
  Administered 2015-06-27 – 2015-06-29 (×5): 20 mg via ORAL
  Filled 2015-06-27 (×5): qty 1

## 2015-06-27 MED ORDER — POTASSIUM CHLORIDE CRYS ER 20 MEQ PO TBCR
40.0000 meq | EXTENDED_RELEASE_TABLET | Freq: Once | ORAL | Status: AC
  Administered 2015-06-27: 40 meq via ORAL
  Filled 2015-06-27: qty 2

## 2015-06-27 NOTE — Progress Notes (Signed)
Nutrition Follow-up  DOCUMENTATION CODES:   Obesity unspecified  INTERVENTION:  Provide Ensure Enlive po TID, each supplement provides 350 kcal and 20 grams of protein   NUTRITION DIAGNOSIS:   Inadequate oral intake related to poor appetite as evidenced by meal completion < 25%.  ongoing  GOAL:   Patient will meet greater than or equal to 90% of their needs  Unmet  MONITOR:   PO intake, Supplement acceptance, Labs, Weight trends, Skin, I & O's  REASON FOR ASSESSMENT:   Consult Enteral/tube feeding initiation and management  ASSESSMENT:   Pt with hx of untreated HTN admitted after ICH.   Pt was extubated on 9/28 and advanced to a Dysphagia 3 diet on 9/29; now on a heart healthy diet. Pt states that her appetite has improved in the past couple days and she is now eating 25% of her meals; previously she was eating 5-10%. She denies any nausea or abdominal pain. RD emphasized the importance of adequate nutrient and protein intake. Pt tried Ensure Enlive and is agreeable to drinking 2-3 times per day until meal completion improves to >75%. Pt reports usual body weight of 195 lbs.   Labs: low phosphorus  Diet Order:  Diet Heart Room service appropriate?: Yes; Fluid consistency:: Thin  Skin:  Reviewed, no issues  Last BM:  9/30  Height:   Ht Readings from Last 1 Encounters:  06/26/15  (1.626 m)    Weight:   Wt Readings from Last 1 Encounters:  06/26/15 201 lb 14.4 oz (91.581 kg)    Ideal Body Weight:  59 kg  BMI:  Body mass index is 34.64 kg/(m^2).  Estimated Nutritional Needs:   Kcal:  1700-2000  Protein:  85-100 grams  Fluid:  1.7-2 L/day  EDUCATION NEEDS:   No education needs identified at this time  Dorothea Ogle RD, LDN Inpatient Clinical Dietitian Pager: [phone] After Hours Pager: [phone]

## 2015-06-27 NOTE — Progress Notes (Signed)
Speech Language Pathology Treatment: Dysphagia;Cognitive-Linquistic  Patient Details Name: Katie Nichols MRN: [id number] DOB: 20-Feb-1971 Today's Date: 06/27/2015 Time: 1000-1032 SLP Time Calculation (min) (ACUTE ONLY): 32 min  Assessment / Plan / Recommendation Clinical Impression  Pt was scheduled for repeat MBS today, but upon clinical reassessment of swallow it was clear that function has improved significantly - pt consumed regular solids (still with mild right residue); large, successive thin liquid boluses from a straw with no s/s of aspiration.  Given improved oral motor/sensory function, recommend advancing diet to regular consistency and thin liquids.  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  Comprehension characterized today by 100% accuracy following one, two, and multistep commands; 90% accuracy when responding to complex questions and questions after listening to paragraphs.  Confrontation and responsive naming were 90% accurate.  Divergent naming, description and propositional speech were the primary areas of deficit - however, pt shows good recognition of errors.  Recommend continued SLP intervention here and in next venue of care to address aphasia.  Pt in agreement.   HPI Other Pertinent Information: Katie Nichols is a 44 y.o. female hx of HTN, asthma presenting with SOB and code stroke with acute onset of right sided weakness and sensory deficits. CT head imaging reviewed shows left basal ganglia ICH with minimal intra-ventricular extension with around 3mm midline shift. Intubated 9/26-9/28.    Pertinent Vitals Pain Assessment: No/denies pain  SLP Plan  Continue with current plan of care    Recommendations Diet recommendations: Regular;Thin liquid Liquids provided via: Straw;Cup Medication Administration: Whole meds with liquid Supervision: Patient able to self feed              Oral Care Recommendations:  Oral care BID Follow up Recommendations: Inpatient Rehab Plan: Continue with current plan of care    GO     Katie Nichols 06/27/2015, 10:34 AM

## 2015-06-27 NOTE — Progress Notes (Signed)
Physical Therapy Treatment Patient Details Name: Katie Nichols MRN: [id number] DOB: 01-Dec-1970 Today's Date: 06/27/2015    History of Present Illness Patient is a 44 yo female admitted 06/19/15 with acute onset of right sided weakness and sensory deficits.  Code stroke activated. On EMS arrival noted to have BP of 280/180 with HR in the 120s. Patient intubated in ED.  CT head imaging shows left basal ganglia ICH with minimal intra-ventricular extension with around 3mm midline shift. No signs of early hydrocephalus. Initial NIHSS of 12.  Renal US showed right RAS.  Patient extubated 06/21/15.    PT Comments    Pt con't with dense R hemiplegia and unable to demo any activation of UE or LE. Pt with improved sitting EOB tolerance without assist. Pt con't to require maximal assist x2 for transfer OOB to chair. Highly recommend CIR upon d/c as pt is only 45 yo and was completely independent and working PTA.  Follow Up Recommendations  CIR     Equipment Recommendations  Wheelchair (measurements PT);Wheelchair cushion (measurements PT)    Recommendations for Other Services Rehab consult     Precautions / Restrictions Precautions Precautions: Fall Precaution Comments: Rt hemiplegia, vision deficits/diplopia Restrictions Weight Bearing Restrictions: No    Mobility  Bed Mobility Overal bed mobility: Needs Assistance Bed Mobility: Rolling;Sidelying to Sit Rolling: Min assist Sidelying to sit: Min assist       General bed mobility comments: pt able to push with L uE into sitting, minA to achieve midline  Transfers Overall transfer level: Needs assistance Equipment used:  (2 person lift with bed pad) Transfers: Sit to/from [employer] Sit to Stand: Mod assist;+2 physical assistance Stand pivot transfers: Max assist;+2 physical assistance       General transfer comment: pt with R knee buckling in standing requiring max blocking. attempted to weight shift on R LE however  no activiation of R LE mm noted. Pt unable to use R UE functionally  Ambulation/Gait                 Stairs            Wheelchair Mobility    Modified Rankin (Stroke Patients Only) Modified Rankin (Stroke Patients Only) Pre-Morbid Rankin Score: No symptoms Modified Rankin: Severe disability     Balance Overall balance assessment: Needs assistance Sitting-balance support: Feet supported;No upper extremity supported Sitting balance-Leahy Scale: Poor Sitting balance - Comments: pt able to tolerate sitting EOB x 5 min statically without LOB. Pt in attempting to reach or scoot further to EOB pt with LOB requiring maxA to prevent falling back onto bed Postural control: Right lateral lean     Standing balance comment: unable without maximal external support                    Cognition Arousal/Alertness: Awake/alert Behavior During Therapy: Flat affect Overall Cognitive Status: Impaired/Different from baseline Area of Impairment: Safety/judgement;Awareness         Safety/Judgement: Decreased awareness of deficits;Decreased awareness of safety Awareness: Emergent Problem Solving: Requires verbal cues;Requires tactile cues;Difficulty sequencing General Comments: pt confused, pt trying to discuss 44yo and his problems at school however the issues were not relateable.    Exercises Other Exercises Other Exercises: worked on increasing R UE WBing via leaning down onto R elbow in sitting and pushing back up into midline x 10 reps    General Comments General comments (skin integrity, edema, etc.): pt reports double vision  Pertinent Vitals/Pain Pain Assessment: No/denies pain    Home Living                      Prior Function            PT Goals (current goals can now be found in the care plan section) Progress towards PT goals: Progressing toward goals    Frequency  Min 4X/week    PT Plan Current plan remains appropriate     Co-evaluation             End of Session Equipment Utilized During Treatment: Gait belt Activity Tolerance: Patient tolerated treatment well Patient left: in chair;with chair alarm set     Time: 1200-1230 PT Time Calculation (min) (ACUTE ONLY): 30 min  Charges:  $Therapeutic Activity: 23-37 mins                    G Codes:      Marcene Brawn 06/27/2015, 1:02 PM  Lewis Shock, PT, DPT Pager #: [phone] Office #: [phone]

## 2015-06-27 NOTE — Progress Notes (Signed)
Subjective: Interval History: has no complaint ,but alert.  Objective: Vital signs in last 24 hours: Temp:  [98.1 F (36.7 C)-98.7 F (37.1 C)] 98.4 F (36.9 C) (10/04 0834) Pulse Rate:  [61-78] 74 (10/04 0834) Resp:  [16-40] 16 (10/04 0834) BP: (128-180)/(69-102) 141/73 mmHg (10/04 0834) SpO2:  [90 %-96 %] 96 % (10/04 0834) Weight:  [91.581 kg (201 lb 14.4 oz)] 91.581 kg (201 lb 14.4 oz) (10/03 2043) Weight change:   Intake/Output from previous day: 10/03 0701 - 10/04 0700 In: 50 [IV Piggyback:50] Out: 525 [Urine:525] Intake/Output this shift:    General appearance: alert, mildly obese, pale and hemiparesis Resp: diminished breath sounds bilaterally Cardio: S1, S2 normal and systolic murmur: holosystolic 2/6, buzzing at apex GI: soft, non-tender; bowel sounds normal; no masses,  no organomegaly Extremities: edema Tr  Lab Results:  Recent Labs  06/27/15 0651  WBC 6.2  HGB 14.0  HCT 43.3  PLT 195   BMET:  Recent Labs  06/26/15 1250 06/27/15 0651  NA 139 138  K 2.9* 3.4*  CL 105 104  CO2 25 25  GLUCOSE 127* 123*  BUN 22* 24*  CREATININE 0.64 0.73  CALCIUM 8.7* 8.5*   No results for input(s): PTH in the last 72 hours. Iron Studies: No results for input(s): IRON, TIBC, TRANSFERRIN, FERRITIN in the last 72 hours.  Studies/Results: Ct Abdomen Pelvis W Contrast  06/25/2015   CLINICAL DATA:  44 year old female with uncontrolled secondary/malignant hypertension. Evaluate for adrenal mass or pheochromocytoma.  EXAM: CT ABDOMEN AND PELVIS WITH CONTRAST  TECHNIQUE: Multidetector CT imaging of the abdomen and pelvis was performed using the standard protocol following bolus administration of intravenous contrast.  CONTRAST:  OMNIPAQUE IOHEXOL 300 MG/ML  SOLN  COMPARISON:  None.  FINDINGS: Lower chest:  Minimal bibasilar atelectasis noted.  Hepatobiliary: The liver and gallbladder are unremarkable. There is no evidence of biliary dilatation.  Pancreas: Unremarkable   Spleen: Unremarkable  Adrenals/Urinary Tract: Minimal perinephric fluid bilaterally noted. The kidneys are otherwise unremarkable.  The adrenal glands are unremarkable without evidence of adrenal mass. There is no evidence of soft tissue mass along the sympathetic chain or bladder.  Stomach/Bowel: Unremarkable. There is no evidence of bowel obstruction or focal bowel wall thickening. The appendix is normal.  Vascular/Lymphatic: No enlarged lymph nodes or abdominal aortic aneurysm. Aortic atherosclerotic calcifications are noted.  Reproductive: The uterus and adnexal regions are unremarkable.  Other: No free fluid, abscess or pneumoperitoneum.  Musculoskeletal: No acute or suspicious abnormality.  IMPRESSION: No evidence of adrenal mass or pheochromocytoma within the abdomen or pelvis. If there is strong clinical concern, recommend nuclear medicine study for further evaluation.  Minimal perinephric fluid bilaterally which is nonspecific but may be related to renal inflammation.   Electronically Signed   By: Harmon Pier M.D.   On: 06/25/2015 14:49    I have reviewed the patient's current medications.  Assessment/Plan: 1 HTN improving , will gradually try to simplify regimen and improve control.   2 ICB 3 ?RAS not emergent issue P change labet to carvedilol, ^ lisinopril, K     LOS: 8 days   Katie Nichols L 06/27/2015,9:25 AM

## 2015-06-27 NOTE — Progress Notes (Signed)
I met with pt at bedside to discuss her caregiver support at home. Her mother is unable to provide any physical assistance. Her ex husband is supportive but works and she has no other 24/7 assistance at home. I will discuss with rehab team and then advise RN CM and SW of plans. [phone]

## 2015-06-27 NOTE — Progress Notes (Signed)
STROKE TEAM PROGRESS NOTE   SUBJECTIVE (INTERVAL HISTORY) No family is at the bedside. Neuro stable overnight. Blood pressure  better controlled after oral medications   .await rehab decision OBJECTIVE Temp:  [98.1 F (36.7 C)-98.7 F (37.1 C)] 98.4 F (36.9 C) (10/04 1320) Pulse Rate:  [61-79] 79 (10/04 1320) Cardiac Rhythm:  [-] Normal sinus rhythm (10/04 0825) Resp:  [16-40] 16 (10/04 1320) BP: (115-180)/(59-102) 115/59 mmHg (10/04 1320) SpO2:  [90 %-96 %] 92 % (10/04 1320) Weight:  [201 lb 14.4 oz (91.581 kg)] 201 lb 14.4 oz (91.581 kg) (10/03 2043)  CBC:   Recent Labs Lab 06/24/15 0621 06/27/15 0651  WBC 7.6 6.2  NEUTROABS 5.7  --   HGB 14.9 14.0  HCT 44.8 43.3  MCV 87.2 87.3  PLT 229 195    Basic Metabolic Panel:  Recent Labs Lab 06/22/15 0235  06/23/15 0600  06/27/15 0651 06/27/15 1032  NA 147*  < > 150*  < > 138 139  K 2.8*  --  2.4*  < > 3.4* 3.6  CL 110  --  112*  < > 104 106  CO2 26  --  29  < > 25 25  GLUCOSE 148*  --  134*  < > 123* 137*  BUN 14  --  9  < > 24* 24*  CREATININE 0.52  --  0.49  < > 0.73 0.65  CALCIUM 9.3  --  8.9  < > 8.5* 8.9  MG 2.1  --  1.8  --   --   --   PHOS <1.0*  --  1.7*  < > 2.6 2.1*  < > = values in this interval not displayed.  Lipid Panel:     Component Value Date/Time   CHOL 176 06/20/2015 2120   TRIG 69 06/20/2015 2120   HDL 53 06/20/2015 2120   CHOLHDL 3.3 06/20/2015 2120   VLDL 14 06/20/2015 2120   LDLCALC 109* 06/20/2015 2120   HgbA1c:  Lab Results  Component Value Date   HGBA1C 5.6 06/19/2015   Urine Drug Screen:     Component Value Date/Time   LABOPIA NONE DETECTED 06/21/2015 1233   COCAINSCRNUR NONE DETECTED 06/21/2015 1233   LABBENZ NONE DETECTED 06/21/2015 1233   AMPHETMU NONE DETECTED 06/21/2015 1233   THCU POSITIVE* 06/21/2015 1233   LABBARB NONE DETECTED 06/21/2015 1233      IMAGING I have personally reviewed the radiological images below and agree with the radiology  interpretations. Blue text is my interpretation.  Ct Head Wo Contrast  06/23/2015   IMPRESSION: Evolving LEFT thalamus intraparenchymal hematoma, local mass-effect.  Similar intraventricular blood products, mild hydrocephalus. Mild global parenchymal edema.    06/21/2015   IMPRESSION: 1. Stable size of left thalamic hematoma with similar localized vasogenic edema. 2. Overall slight interval decrease in intraventricular hemorrhage with stable mild dilatation of the temporal horns. 3. Stable 6 mm left-to-right shift at the level of the hematoma.   06/20/2015    1. Unchanged left thalamic hematoma.  2. Newly seen right lateral ventricle and interpeduncular fossa hemorrhage is likely redistribution. Overall, intraventricular hematoma volume is similar to yesterday. However, more prominent midline shift comparing with last CT head. 3. Stable mild dilation of the temporal horns.     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 No early hydrocephalus. 3 mm of LEFT-to- RIGHT midline shift.    Dg Chest Port 1 View 06/19/2015    1. Endotracheal tube tip 26  mm from the carina, in good position.  2. Enteric tube tip not visible.  3. No focal consolidation. Suggestion of pulmonary vascular congestion.     2D ehco - - Left ventricle: The cavity size was normal. Wall thickness was normal. Systolic function was normal. The estimated ejection fraction was in the range of 60% to 65%. Wall motion was normal; there were no regional wall motion abnormalities. Doppler parameters are consistent with abnormal left ventricular relaxation (grade 1 diastolic dysfunction). - Mitral valve: Transvalvular velocity was within the normal range. There was no evidence for stenosis. There was no regurgitation. - Right ventricle: The cavity size was normal. Wall thickness was normal. Systolic function was normal. - Tricuspid valve: There was trivial regurgitation. -  Pulmonary arteries: PA peak pressure: 40 mm Hg (S). - Inferior vena cava: The vessel was normal in size. The respirophasic diameter changes were blunted (< 50%), consistent with normal central venous pressure.  Renal artery [place] - Elevated velocities in the right renal artery are consistent with 60-99% stenosis. Left renal artery appeared within normal limits. Results given to patient's nurse.    PHYSICAL EXAM  Temp:  [98.1 F (36.7 C)-98.7 F (37.1 C)] 98.4 F (36.9 C) (10/04 1320) Pulse Rate:  [61-79] 79 (10/04 1320) Resp:  [16-40] 16 (10/04 1320) BP: (115-180)/(59-102) 115/59 mmHg (10/04 1320) SpO2:  [90 %-96 %] 92 % (10/04 1320) Weight:  [201 lb 14.4 oz (91.581 kg)] 201 lb 14.4 oz (91.581 kg) (10/03 2043)  General - mildly obese, well developed, awake alert.  Ophthalmologic - Fundi not visualized due to noncooperation.  Cardiovascular - Regular rate and rhythm with no murmur.  Neuro - awake alert, able to follow all simple commands, with  Mild transcortical motor aphasia. nonfluent speech.PERRL, EOMI, blinking to visual threat on the both sides, right facial droop, RUE 0/5 and RLE 1/5  on pain stimulation, right side decreased sensation with pain stimulation, LUE and LLE spontaneous movement against gravity, babinski mute b/l, DTR 1+. No ataxia on the LUE.    ASSESSMENT/PLAN Ms. Katie Nichols is a 43 y.o. female with history of hypertension and asthma presenting with right-sided weakness and sensory deficits with blood pressure  280/180. She did not receive IV t-PA due to acute left basal ganglia hemorrhage.   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   Resultant  Right hemiplegia, transcortical motor aphasia.  MRI / MRA not ordered yet   Repeat CT head showed stable hematoma and no hydrocephalus  UDS - positive for THC  Renal artery US showed right RAS  2D Echo  unremarkable  LDL 109  HgbA1c 5.6  VTE prophylaxis subq heparin  Diet Heart Room service appropriate?: Yes; Fluid consistency:: Thin  no antithrombotic prior to admission, now on no antithrombotic secondary to hemorrhage  Ongoing aggressive stroke risk factor management  Therapy recommendations: CLR  Disposition: Pending  Cerebral edema  Although not large volume ICH, however, medial thalamic ICH causing significant midline shift and uncal herniation  Repeat CT stable  tNow off 3% saline  Na 136 -> 144 -> 147 -> 148 -> 150  Hypertension, malignant  On cleviprex and esmolol drip Plan to taper drips  Change BP goal < 180 On po meds with amlodipine, metoprolol, hydralazine, clonidine and spiranolactone Renal artery ultrasound showed right RAS Aldosterone, Aldo/PRA ratio normal Appreciate help from Renal service for BP management  RAS  Ultrasound showed  right RAS  VVS on board -plan elective renal angio towards end of rehab stay  Consider angiogram on Monday  Hyperlipidemia   Not on any home meds  LDL 109, goal < 70  Add lipitor    Continue statin on discharge.  Other Stroke Risk Factors  Obesity, Body mass index is 34.64 kg/(m^2).   Other Active Problems  Hypokalemia - on supplement  Hospital day # 8  Patient is neurologically stable and now  Bp controlled on oral BP meds. Appreciate renal service help with BP mangement. Plan to transfer to inpatient rehab over next few days and renal angiogram/angioplasty towards end of rehab stay Delia Heady, MD Stroke Neurology 06/27/2015 1:32 PM   To contact Stroke Continuity provider, please refer to [email or web address]. After hours, contact General Neurology

## 2015-06-28 ENCOUNTER — Inpatient Hospital Stay (HOSPITAL_COMMUNITY): Payer: Self-pay | Admitting: Physical Therapy

## 2015-06-28 LAB — METANEPHRINES, URINE, 24 HOUR
METANEPHRINES 24H UR: 381 ug/(24.h) — AB (ref 45–290)
Metaneph Total, Ur: 127 ug/L
Normetanephrine, 24H Ur: 1140 ug/24 hr — ABNORMAL HIGH (ref 82–500)
Normetanephrine, Ur: 380 ug/L
TOTAL VOLUME: 3000

## 2015-06-28 LAB — ALDOSTERONE: Aldosterone: 2.2 ng/dL (ref 0.0–30.0)

## 2015-06-28 LAB — RENIN: Renin Activity: 0.1 ng/mL/h — ABNORMAL LOW (ref 0.25–5.82)

## 2015-06-28 MED ORDER — HYDRALAZINE HCL 50 MG PO TABS
100.0000 mg | ORAL_TABLET | Freq: Two times a day (BID) | ORAL | Status: DC
  Administered 2015-06-28 – 2015-06-29 (×2): 100 mg via ORAL
  Filled 2015-06-28 (×2): qty 2

## 2015-06-28 NOTE — Progress Notes (Signed)
Speech Language Pathology Treatment: Dysphagia;Cognitive-Linquistic  Patient Details Name: Katie Nichols MRN: [id number] DOB: November 02, 1970 Today's Date: 06/28/2015 Time: 6962-9528 SLP Time Calculation (min) (ACUTE ONLY): 15 min  Assessment / Plan / Recommendation Clinical Impression  SLP provided skilled observation with regular textures and thin liquids, which pt consumed with Mod I without overt signs of aspiration or dysphagia. Would continue with current recommendations without further dysphagia treatment needed.  Therapy session also focused on pt's linguistic abilities by means of divergent naming tasks. Pt was initially able to come up with two words per category in one minute, however with Mod cues from SLP for visualization she was able to produce 10 words per category. Pt has good awareness of deficits, but continues to need assistance with utilization of strategies to facilitate output. Pt needed only Min A from SLP for communication of basic wants/needs during session, relying on her own verbal output as well as gestures.    HPI Other Pertinent Information: Katie Nichols is a 44 y.o. female hx of HTN, asthma presenting with SOB and code stroke with acute onset of right sided weakness and sensory deficits. CT head imaging reviewed shows left basal ganglia ICH with minimal intra-ventricular extension with around 3mm midline shift. Intubated 9/26-9/28.    Pertinent Vitals Pain Assessment: No/denies pain  SLP Plan  Goals updated    Recommendations Diet recommendations: Regular;Thin liquid Liquids provided via: Straw;Cup Medication Administration: Whole meds with liquid Supervision: Patient able to self feed Compensations: Slow rate;Small sips/bites;Check for pocketing Postural Changes and/or Swallow Maneuvers: Seated upright 90 degrees       Oral Care Recommendations: Oral care BID Follow up Recommendations: Inpatient Rehab Plan: Goals updated    Maxcine Ham, M.A.  CCC-SLP [phone]  Maxcine Ham 06/28/2015, 11:58 AM

## 2015-06-28 NOTE — Progress Notes (Signed)
STROKE TEAM PROGRESS NOTE   SUBJECTIVE (INTERVAL HISTORY) No family is at the bedside. Neuro stable overnight. Blood pressure  better controlled after oral medications   .Rehab recommends SNF. FL2 signed OBJECTIVE Temp:  [97.4 F (36.3 C)-99.2 F (37.3 C)] 97.7 F (36.5 C) (10/05 1010) Pulse Rate:  [63-90] 88 (10/05 1010) Cardiac Rhythm:  [-] Normal sinus rhythm (10/05 0700) Resp:  [16-18] 18 (10/05 1010) BP: (120-166)/(66-96) 122/85 mmHg (10/05 1010) SpO2:  [92 %-98 %] 95 % (10/05 1010)  CBC:   Recent Labs Lab 06/24/15 0621 06/27/15 0651  WBC 7.6 6.2  NEUTROABS 5.7  --   HGB 14.9 14.0  HCT 44.8 43.3  MCV 87.2 87.3  PLT 229 195    Basic Metabolic Panel:  Recent Labs Lab 06/22/15 0235  06/23/15 0600  06/27/15 0651 06/27/15 1032  NA 147*  < > 150*  < > 138 139  K 2.8*  --  2.4*  < > 3.4* 3.6  CL 110  --  112*  < > 104 106  CO2 26  --  29  < > 25 25  GLUCOSE 148*  --  134*  < > 123* 137*  BUN 14  --  9  < > 24* 24*  CREATININE 0.52  --  0.49  < > 0.73 0.65  CALCIUM 9.3  --  8.9  < > 8.5* 8.9  MG 2.1  --  1.8  --   --   --   PHOS <1.0*  --  1.7*  < > 2.6 2.1*  < > = values in this interval not displayed.  Lipid Panel:     Component Value Date/Time   CHOL 176 06/20/2015 2120   TRIG 69 06/20/2015 2120   HDL 53 06/20/2015 2120   CHOLHDL 3.3 06/20/2015 2120   VLDL 14 06/20/2015 2120   LDLCALC 109* 06/20/2015 2120   HgbA1c:  Lab Results  Component Value Date   HGBA1C 5.6 06/19/2015   Urine Drug Screen:     Component Value Date/Time   LABOPIA NONE DETECTED 06/21/2015 1233   COCAINSCRNUR NONE DETECTED 06/21/2015 1233   LABBENZ NONE DETECTED 06/21/2015 1233   AMPHETMU NONE DETECTED 06/21/2015 1233   THCU POSITIVE* 06/21/2015 1233   LABBARB NONE DETECTED 06/21/2015 1233      IMAGING I have personally reviewed the radiological images below and agree with the radiology interpretations. Blue text is my interpretation.  Ct Head Wo Contrast  06/23/2015    IMPRESSION: Evolving LEFT thalamus intraparenchymal hematoma, local mass-effect.  Similar intraventricular blood products, mild hydrocephalus. Mild global parenchymal edema.    06/21/2015   IMPRESSION: 1. Stable size of left thalamic hematoma with similar localized vasogenic edema. 2. Overall slight interval decrease in intraventricular hemorrhage with stable mild dilatation of the temporal horns. 3. Stable 6 mm left-to-right shift at the level of the hematoma.   06/20/2015    1. Unchanged left thalamic hematoma.  2. Newly seen right lateral ventricle and interpeduncular fossa hemorrhage is likely redistribution. Overall, intraventricular hematoma volume is similar to yesterday. However, more prominent midline shift comparing with last CT head. 3. Stable mild dilation of the temporal horns.     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 No early hydrocephalus. 3 mm of LEFT-to- RIGHT midline shift.    Dg Chest Port 1 View 06/19/2015    1. Endotracheal tube tip 26 mm from the carina, in good position.  2. Enteric tube tip not visible.  3. No focal consolidation. Suggestion of pulmonary vascular congestion.     2D ehco - - Left ventricle: The cavity size was normal. Wall thickness was normal. Systolic function was normal. The estimated ejection fraction was in the range of 60% to 65%. Wall motion was normal; there were no regional wall motion abnormalities. Doppler parameters are consistent with abnormal left ventricular relaxation (grade 1 diastolic dysfunction). - Mitral valve: Transvalvular velocity was within the normal range. There was no evidence for stenosis. There was no regurgitation. - Right ventricle: The cavity size was normal. Wall thickness was normal. Systolic function was normal. - Tricuspid valve: There was trivial regurgitation. - Pulmonary arteries: PA peak pressure: 40 mm Hg (S). - Inferior vena cava: The vessel was  normal in size. The respirophasic diameter changes were blunted (< 50%), consistent with normal central venous pressure.  Renal artery [place] - Elevated velocities in the right renal artery are consistent with 60-99% stenosis. Left renal artery appeared within normal limits. Results given to patient's nurse.    PHYSICAL EXAM  Temp:  [97.4 F (36.3 C)-99.2 F (37.3 C)] 97.7 F (36.5 C) (10/05 1010) Pulse Rate:  [63-90] 88 (10/05 1010) Resp:  [16-18] 18 (10/05 1010) BP: (120-166)/(66-96) 122/85 mmHg (10/05 1010) SpO2:  [92 %-98 %] 95 % (10/05 1010)  General - mildly obese, well developed, awake alert.  Ophthalmologic - Fundi not visualized due to noncooperation.  Cardiovascular - Regular rate and rhythm with no murmur.  Neuro - awake alert, able to follow all simple commands, with  Mild transcortical motor aphasia. nonfluent speech.PERRL, EOMI, blinking to visual threat on the both sides, right facial droop, RUE 0/5 and RLE 1/5  on pain stimulation, right side decreased sensation with pain stimulation, LUE and LLE spontaneous movement against gravity, babinski mute b/l, DTR 1+. No ataxia on the LUE.    ASSESSMENT/PLAN Ms. Katie Nichols is a 44 y.o. female with history of hypertension and asthma presenting with right-sided weakness and sensory deficits with blood pressure  280/180. She did not receive IV t-PA due to acute left basal ganglia hemorrhage.   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   Resultant  Right hemiplegia, transcortical motor aphasia.  MRI / MRA not ordered yet   Repeat CT head showed stable hematoma and no hydrocephalus  UDS - positive for THC  Renal artery US showed right RAS  2D Echo unremarkable  LDL 109  HgbA1c 5.6  VTE prophylaxis subq heparin  Diet Heart Room service appropriate?: Yes; Fluid consistency:: Thin  no antithrombotic prior to  admission, now on no antithrombotic secondary to hemorrhage  Ongoing aggressive stroke risk factor management  Therapy recommendations: CLR  Disposition: SNF  Cerebral edema  Although not large volume ICH, however, medial thalamic ICH causing significant midline shift and uncal herniation  Repeat CT stable  tNow off 3% saline  Na 136 -> 144 -> 147 -> 148 -> 150  Hypertension, malignant  On cleviprex and esmolol drip Plan to taper drips  Change BP goal < 180 On po meds with amlodipine, metoprolol, hydralazine, clonidine and spiranolactone Renal artery ultrasound showed right RAS Aldosterone, Aldo/PRA ratio normal Appreciate help from Renal service for BP management  RAS  Ultrasound showed right RAS  VVS on board -plan elective renal angio towards end of rehab stay  Consider angiogram on Monday  Hyperlipidemia   Not on any home meds  LDL  109, goal < 70  Add lipitor    Continue statin on discharge.  Other Stroke Risk Factors  Obesity, Body mass index is 34.64 kg/(m^2).   Other Active Problems  Hypokalemia - on supplement  Hospital day # 9  Patient is neurologically stable and now  Bp controlled on oral BP meds.  . Plan to transfer to SNF rehab over next few days and renal angiogram/angioplasty towards end of rehab stay. D/W Dr Imogene Burn Delia Heady, MD Stroke Neurology 06/28/2015 1:32 PM   To contact Stroke Continuity provider, please refer to [email or web address]. After hours, contact General Neurology

## 2015-06-28 NOTE — Clinical Social Work Placement (Signed)
   CLINICAL SOCIAL WORK PLACEMENT  NOTE  Date:  06/28/2015  Patient Details  Name: Katie Nichols MRN: [id number] Date of Birth: 06/20/1971  Clinical Social Work is seeking post-discharge placement for this patient at the Skilled  Nursing Facility level of care (*CSW will initial, date and re-position this form in  chart as items are completed):  Yes   Patient/family provided with [facility] Clinical Social Work Department's list of facilities offering this level of care within the geographic area requested by the patient (or if unable, by the patient's family).  Yes   Patient/family informed of their freedom to choose among providers that offer the needed level of care, that participate in Medicare, Medicaid or managed care program needed by the patient, have an available bed and are willing to accept the patient.  Yes   Patient/family informed of [facility]'s ownership interest in [facility] and [facility], as well as of the fact that they are under no obligation to receive care at these facilities.  PASRR submitted to EDS on 06/28/15     PASRR number received on 06/28/15     Existing PASRR number confirmed on       FL2 transmitted to all facilities in geographic area requested by pt/family on 06/28/15     FL2 transmitted to all facilities within larger geographic area on 06/28/15     Patient informed that his/her managed care company has contracts with or will negotiate with certain facilities, including the following:        Yes   Patient/family informed of bed offers received.  Patient chooses bed at  ([name])     Physician recommends and patient chooses bed at      Patient to be transferred to  ([name]) on  .  Patient to be transferred to facility by       Patient family notified on   of transfer.  Name of family member notified:        PHYSICIAN Please sign FL2, Please prepare prescriptions     Additional  Comment:    _______________________________________________ Derenda Fennel, MSW, LCSWA [phone] 06/28/2015 2:35 PM

## 2015-06-28 NOTE — Progress Notes (Signed)
Pt has limited caregiver support at home and has significant deficits requiring extended rehab before returning home with her mother who can offer limited support. She will need SNF rehab. RN CM and SW are aware. We will sign off. [phone]

## 2015-06-28 NOTE — Progress Notes (Signed)
Subjective: Interval History: has complaints working on strength.  Objective: Vital signs in last 24 hours: Temp:  [97.4 F (36.3 C)-99.2 F (37.3 C)] 98.6 F (37 C) (10/05 0615) Pulse Rate:  [63-90] 63 (10/05 0615) Resp:  [16-18] 16 (10/05 0615) BP: (115-163)/(59-96) 163/96 mmHg (10/05 0615) SpO2:  [92 %-98 %] 98 % (10/05 0615) Weight change:   Intake/Output from previous day: 10/04 0701 - 10/05 0700 In: 120 [P.O.:120] Out: 2850 [Urine:2850] Intake/Output this shift:    General appearance: alert, cooperative, mildly obese and pale Resp: clear to auscultation bilaterally Cardio: S1, S2 normal, S4 present and systolic murmur: holosystolic 2/6, blowing at apex GI: soft, non-tender; bowel sounds normal; no masses,  no organomegaly Extremities: extremities normal, atraumatic, no cyanosis or edema  Lab Results:  Recent Labs  06/27/15 0651  WBC 6.2  HGB 14.0  HCT 43.3  PLT 195   BMET:  Recent Labs  06/27/15 0651 06/27/15 1032  NA 138 139  K 3.4* 3.6  CL 104 106  CO2 25 25  GLUCOSE 123* 137*  BUN 24* 24*  CREATININE 0.73 0.65  CALCIUM 8.5* 8.9   No results for input(s): PTH in the last 72 hours. Iron Studies: No results for input(s): IRON, TIBC, TRANSFERRIN, FERRITIN in the last 72 hours.  Studies/Results: No results found.  I have reviewed the patient's current medications.  Assessment/Plan: 1 HTN improving will start to simplify regimen.  Hopefully get on bid meds 2 ICB 3 ?RAS not emergent now P lower hydral, follow chem    LOS: 9 days   Katie Nichols L 06/28/2015,7:59 AM

## 2015-06-28 NOTE — Clinical Social Work Note (Signed)
Clinical Social Work Assessment  Patient Details  Name: Katie Nichols MRN: [id number] Date of Birth: 1971-09-08  Date of referral:  06/28/15               Reason for consult:  Facility Placement, Discharge Planning                Permission sought to share information with:  Case Manager, Facility Sport and exercise psychologist, Family Supports Permission granted to share information::  Yes, Verbal Permission Granted  Name::      Shelisa Fern)  Agency::   (SNF's )  Relationship::   (Mother )  Contact Information:   [phone])  Housing/Transportation Living arrangements for the past 2 months:  Single Family Home Source of Information:  Patient Patient Interpreter Needed:  None Criminal Activity/Legal Involvement Pertinent to Current Situation/Hospitalization:  No - Comment as needed Significant Relationships:  Dependent Children, Parents, Friend Lives with:  Parents Do you feel safe going back to the place where you live?  No Need for family participation in patient care:  Yes (Comment)  Care giving concerns:  Patient requiring continued therapy at skilled nursing level.   Social Worker assessment / plan:  Holiday representative met with patient in reference to post-acute placement for SNF. CSW introduced CSW role and SNF process. Patient is uninsured and will require Letter of Guarantee (LOG) placement. CSW explained LOG placement and made patient aware that she may be placed outside of [facility]. Patient expressed understanding and stated "I will have to do what I have to do". Patient confirmed that she is from home with her mother and dependent child. Pt has also reported that she has other supportive family members and friends however they are not available to assist during the day. Patient has been approved for 30 days room/board and 14 days PT/OT. CSW has completed FL-2 and faxed clinicals to surrounding counties. Patient has one bed offer at [facility].  Attending to be notified. No further concerns reported by patient at this time.  CSW will continue to follow pt and pt's family for continued support and to facilitate pt's discharge needs once medically stable.    Employment status:  Unemployed Forensic scientist:  Self Pay (Medicaid Pending) PT Recommendations:  Inpatient Rehab Consult Information / Referral to community resources:  [place]  Patient/Family's Response to care:  Pt alert and oriented x4. Pt agreeable to LOG SNF placement at [facility]. Pt's family supportive and involved in pt's care. Pt pleasant and appreciated social work intervention.   Patient/Family's Understanding of and Emotional Response to Diagnosis, Current Treatment, and Prognosis:  Pt knowledgeable of medical intervention and aware that she has not been accepted into CIR/IP REHAB. Pt agreeable and understanding of SNF placement for 30 Days.   Emotional Assessment Appearance:  Appears stated age Attitude/Demeanor/Rapport:   (Pleasant ) Affect (typically observed):  Accepting, Appropriate, Pleasant, Hopeful Orientation:  Oriented to Self, Oriented to Place, Oriented to  Time, Oriented to Situation Alcohol / Substance use:  Not Applicable Psych involvement (Current and /or in the community):  No (Comment)  Discharge Needs  Concerns to be addressed:  Care Coordination Readmission within the last 30 days:  No Current discharge risk:  Dependent with Mobility Barriers to Discharge:  Inadequate or no insurance, Continued Medical Work up   [employer], MSW, LCSWA [phone] 06/28/2015 2:31 PM

## 2015-06-29 LAB — RENAL FUNCTION PANEL
ALBUMIN: 3.5 g/dL (ref 3.5–5.0)
Anion gap: 10 (ref 5–15)
BUN: 22 mg/dL — AB (ref 6–20)
CHLORIDE: 97 mmol/L — AB (ref 101–111)
CO2: 29 mmol/L (ref 22–32)
Calcium: 8.9 mg/dL (ref 8.9–10.3)
Creatinine, Ser: 0.59 mg/dL (ref 0.44–1.00)
GFR calc Af Amer: >60 mL/min (ref >60)
GFR calc non Af Amer: >60 mL/min (ref >60)
GLUCOSE: 123 mg/dL — AB (ref 65–99)
PHOSPHORUS: 3.1 mg/dL (ref 2.5–4.6)
POTASSIUM: 3.2 mmol/L — AB (ref 3.5–5.1)
Sodium: 136 mmol/L (ref 135–145)

## 2015-06-29 MED ORDER — ADULT MULTIVITAMIN W/MINERALS CH: 1.0000 | ORAL_TABLET | Freq: Every day | ORAL | Status: AC

## 2015-06-29 MED ORDER — FUROSEMIDE 80 MG PO TABS: 80.0000 mg | ORAL_TABLET | Freq: Two times a day (BID) | ORAL | Status: DC

## 2015-06-29 MED ORDER — POTASSIUM CHLORIDE CRYS ER 20 MEQ PO TBCR: 20.0000 meq | EXTENDED_RELEASE_TABLET | Freq: Every day | ORAL | Status: DC

## 2015-06-29 MED ORDER — CARVEDILOL 25 MG PO TABS: 12.5000 mg | ORAL_TABLET | Freq: Two times a day (BID) | ORAL | Status: AC

## 2015-06-29 MED ORDER — HYDRALAZINE HCL 100 MG PO TABS: 100.0000 mg | ORAL_TABLET | Freq: Two times a day (BID) | ORAL | Status: AC

## 2015-06-29 MED ORDER — POTASSIUM CHLORIDE CRYS ER 20 MEQ PO TBCR
40.0000 meq | EXTENDED_RELEASE_TABLET | Freq: Once | ORAL | Status: AC
  Administered 2015-06-29: 40 meq via ORAL
  Filled 2015-06-29: qty 2

## 2015-06-29 MED ORDER — SENNOSIDES-DOCUSATE SODIUM 8.6-50 MG PO TABS: 1.0000 | ORAL_TABLET | Freq: Two times a day (BID) | ORAL | Status: DC

## 2015-06-29 MED ORDER — ENSURE ENLIVE PO LIQD: 237.0000 mL | Freq: Three times a day (TID) | ORAL | Status: DC

## 2015-06-29 MED ORDER — ATORVASTATIN CALCIUM 10 MG PO TABS: 10.0000 mg | ORAL_TABLET | Freq: Every day | ORAL | Status: DC

## 2015-06-29 MED ORDER — POTASSIUM CHLORIDE CRYS ER 20 MEQ PO TBCR
20.0000 meq | EXTENDED_RELEASE_TABLET | Freq: Every day | ORAL | Status: DC
  Administered 2015-06-29: 20 meq via ORAL

## 2015-06-29 MED ORDER — LISINOPRIL 20 MG PO TABS: 20.0000 mg | ORAL_TABLET | Freq: Two times a day (BID) | ORAL | Status: AC

## 2015-06-29 MED ORDER — AMLODIPINE BESYLATE 10 MG PO TABS: 10.0000 mg | ORAL_TABLET | Freq: Every day | ORAL | Status: AC

## 2015-06-29 NOTE — Progress Notes (Signed)
Subjective: Interval History: has no complaint .  Objective: Vital signs in last 24 hours: Temp:  [98.1 F (36.7 C)-99.1 F (37.3 C)] 99.1 F (37.3 C) (10/06 1006) Pulse Rate:  [72-98] 83 (10/06 1006) Resp:  [16-20] 16 (10/06 1006) BP: (119-148)/(63-88) 123/64 mmHg (10/06 1006) SpO2:  [94 %-99 %] 94 % (10/06 1006) Weight change:   Intake/Output from previous day: 10/05 0701 - 10/06 0700 In: -  Out: 2500 [Urine:2500] Intake/Output this shift:    General appearance: cooperative, pale and slowed mentation Resp: diminished breath sounds bilaterally Cardio: S1, S2 normal and systolic murmur: holosystolic 2/6, blowing at apex GI: pos bs, liver down 3 cm ,soft Extremities: edema Tr  Lab Results:  Recent Labs  06/27/15 0651  WBC 6.2  HGB 14.0  HCT 43.3  PLT 195   BMET:  Recent Labs  06/27/15 1032 06/29/15 0706  NA 139 136  K 3.6 3.2*  CL 106 97*  CO2 25 29  GLUCOSE 137* 123*  BUN 24* 22*  CREATININE 0.65 0.59  CALCIUM 8.9 8.9   No results for input(s): PTH in the last 72 hours. Iron Studies: No results for input(s): IRON, TIBC, TRANSFERRIN, FERRITIN in the last 72 hours.  Studies/Results: No results found.  I have reviewed the patient's current medications.  Assessment/Plan: 1 HTn controlled on bid meds will cont 2 ICB per neuro 3 RAS not emergent prob Pcont meds, d/c foley , give K,  Will s/o for now and be glad to see outpatient     LOS: 10 days   Kandra Graven L 06/29/2015,10:42 AM

## 2015-06-29 NOTE — Clinical Social Work Placement (Signed)
   CLINICAL SOCIAL WORK PLACEMENT  NOTE  Date:  06/29/2015  Patient Details  Name: Katie Nichols MRN: [id number] Date of Birth: 1971/03/04  Clinical Social Work is seeking post-discharge placement for this patient at the Skilled  Nursing Facility level of care (*CSW will initial, date and re-position this form in  chart as items are completed):  Yes   Patient/family provided with [facility] Clinical Social Work Department's list of facilities offering this level of care within the geographic area requested by the patient (or if unable, by the patient's family).  Yes   Patient/family informed of their freedom to choose among providers that offer the needed level of care, that participate in Medicare, Medicaid or managed care program needed by the patient, have an available bed and are willing to accept the patient.  Yes   Patient/family informed of [facility]'s ownership interest in [facility] and [facility], as well as of the fact that they are under no obligation to receive care at these facilities.  PASRR submitted to EDS on 06/28/15     PASRR number received on 06/28/15     Existing PASRR number confirmed on       FL2 transmitted to all facilities in geographic area requested by pt/family on 06/28/15     FL2 transmitted to all facilities within larger geographic area on 06/28/15     Patient informed that his/her managed care company has contracts with or will negotiate with certain facilities, including the following:        Yes   Patient/family informed of bed offers received.  Patient chooses bed at  ([name])     Physician recommends and patient chooses bed at      Patient to be transferred to  [facility] on 06/29/15.  Patient to be transferred to facility by  [facility])     Patient family notified on 06/29/15 of transfer.  Name of family member notified:   (patient reported she will notify family.)      PHYSICIAN Please sign FL2, Please prepare prescriptions     Additional Comment:    _______________________________________________ Orson Gear, Student-SW 06/29/2015, 2:15 PM

## 2015-06-29 NOTE — Progress Notes (Signed)
PT Cancellation Note  Patient Details Name: Katie Nichols MRN: [id number] DOB: October 26, 1970   Cancelled Treatment:    Reason Eval/Treat Not Completed: Other (comment); RN reports ambulance transport on the way for d/c to SNF this afternoon and report called.  Will defer further PT to SNF level rehab.   Antonea Gaut,CYNDI 06/29/2015, 3:35 PM  Sheran Lawless, PT [phone] 06/29/2015

## 2015-06-29 NOTE — Discharge Summary (Signed)
Stroke Discharge Summary  Patient ID: Katie Nichols   MRN: [id number]      DOB: [birth year]  Date of Admission: 06/19/2015 Date of Discharge: 06/29/2015  Attending Physician:  Micki Riley, MD, Stroke MD  Consulting Physician(s):   Treatment Team:  Beryle Lathe, MD nephrology,rehabilitation medicine , vascular surgery, Pulmonary Critical Care medicine Patient's PCP:  No primary care provider on file.  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 Active Problems:   ICH (intracerebral hemorrhage) (HCC)   Encounter for intubation   Hypertensive emergency   Ventilator dependent (HCC)   Hypocalcemia   Acute respiratory failure with hypoxemia (HCC)   HTN (hypertension), malignant   Right hemiplegia (HCC)  BMI: Body mass index is 34.64 kg/(m^2).  Past Medical History  Diagnosis Date  . Hypertension    No past surgical history on file.    Medication List    TAKE these medications        amLODipine 10 MG tablet  Commonly known as:  NORVASC  Take 1 tablet (10 mg total) by mouth daily.     atorvastatin 10 MG tablet  Commonly known as:  LIPITOR  Take 1 tablet (10 mg total) by mouth daily at 6 PM.     carvedilol 25 MG tablet  Commonly known as:  COREG  Take 0.5 tablets (12.5 mg total) by mouth 2 (two) times daily with a meal.     feeding supplement (ENSURE ENLIVE) Liqd  Take 237 mLs by mouth 3 (three) times daily after meals.     furosemide 80 MG tablet  Commonly known as:  LASIX  Take 1 tablet (80 mg total) by mouth 2 (two) times daily.     hydrALAZINE 100 MG tablet  Commonly known as:  APRESOLINE  Take 1 tablet (100 mg total) by mouth 2 (two) times daily.     lisinopril 20 MG tablet  Commonly known as:  PRINIVIL,ZESTRIL  Take 1 tablet (20 mg total) by mouth 2 (two) times daily.     multivitamin with minerals Tabs tablet  Place 1 tablet into  feeding tube daily.     potassium chloride SA 20 MEQ tablet  Commonly known as:  K-DUR,KLOR-CON  Take 1 tablet (20 mEq total) by mouth daily.     senna-docusate 8.6-50 MG tablet  Commonly known as:  Senokot-S  Take 1 tablet by mouth 2 (two) times daily.        LABORATORY STUDIES CBC    Component Value Date/Time   WBC 6.2 06/27/2015 0651   RBC 4.96 06/27/2015 0651   HGB 14.0 06/27/2015 0651   HCT 43.3 06/27/2015 0651   PLT 195 06/27/2015 0651   MCV 87.3 06/27/2015 0651   MCH 28.2 06/27/2015 0651   MCHC 32.3 06/27/2015 0651   RDW 14.0 06/27/2015 0651   LYMPHSABS 1.1 06/24/2015 0621   MONOABS 0.7 06/24/2015 0621   EOSABS 0.0 06/24/2015 0621   BASOSABS 0.1 06/24/2015 0621   CMP    Component Value Date/Time   NA 136 06/29/2015 0706   K 3.2* 06/29/2015 0706   CL 97* 06/29/2015 0706   CO2 29 06/29/2015 0706   GLUCOSE 123* 06/29/2015 0706   BUN 22* 06/29/2015 0706   CREATININE 0.59 06/29/2015 0706   CALCIUM 8.9 06/29/2015 0706   PROT 7.1 06/21/2015 1604   ALBUMIN 3.5 06/29/2015 0706   AST 16 06/21/2015 1604  ALT 15 06/21/2015 1604   ALKPHOS 50 06/21/2015 1604   BILITOT 0.4 06/21/2015 1604   GFRNONAA >60 06/29/2015 0706   GFRAA >60 06/29/2015 0706   COAGS Lab Results  Component Value Date   INR 1.01 06/19/2015   Lipid Panel    Component Value Date/Time   CHOL 176 06/20/2015 2120   TRIG 69 06/20/2015 2120   HDL 53 06/20/2015 2120   CHOLHDL 3.3 06/20/2015 2120   VLDL 14 06/20/2015 2120   LDLCALC 109* 06/20/2015 2120   HgbA1C  Lab Results  Component Value Date   HGBA1C 5.6 06/19/2015   Cardiac Panel (last 3 results) No results for input(s): CKTOTAL, CKMB, TROPONINI, RELINDX in the last 72 hours. Urinalysis    Component Value Date/Time   COLORURINE AMBER* 06/26/2015 1258   APPEARANCEUR CLOUDY* 06/26/2015 1258   LABSPEC 1.035* 06/26/2015 1258   PHURINE 5.5 06/26/2015 1258   GLUCOSEU NEGATIVE 06/26/2015 1258   HGBUR NEGATIVE 06/26/2015 1258    BILIRUBINUR SMALL* 06/26/2015 1258   KETONESUR 15* 06/26/2015 1258   PROTEINUR NEGATIVE 06/26/2015 1258   UROBILINOGEN 1.0 06/26/2015 1258   NITRITE NEGATIVE 06/26/2015 1258   LEUKOCYTESUR MODERATE* 06/26/2015 1258   Urine Drug Screen     Component Value Date/Time   LABOPIA NONE DETECTED 06/21/2015 1233   COCAINSCRNUR NONE DETECTED 06/21/2015 1233   LABBENZ NONE DETECTED 06/21/2015 1233   AMPHETMU NONE DETECTED 06/21/2015 1233   THCU POSITIVE* 06/21/2015 1233   LABBARB NONE DETECTED 06/21/2015 1233    Alcohol Level No results found for: ETH   SIGNIFICANT DIAGNOSTIC STUDIES  Ct Head Wo Contrast  06/23/2015 IMPRESSION: Evolving LEFT thalamus intraparenchymal hematoma, local mass-effect. Similar intraventricular blood products, mild hydrocephalus. Mild global parenchymal edema.   06/21/2015 IMPRESSION: 1. Stable size of left thalamic hematoma with similar localized vasogenic edema. 2. Overall slight interval decrease in intraventricular hemorrhage with stable mild dilatation of the temporal horns. 3. Stable 6 mm left-to-right shift at the level of the hematoma.   06/20/2015  1. Unchanged left thalamic hematoma.  2. Newly seen right lateral ventricle and interpeduncular fossa hemorrhage is likely redistribution. Overall, intraventricular hematoma volume is similar to yesterday. However, more prominent midline shift comparing with last CT head. 3. Stable mild dilation of the temporal horns.   06/19/2015  Acute LEFT basal ganglia hemorrhage with intraventricular breakthrough. No early hydrocephalus. 3 mm of LEFT-to- RIGHT midline shift.   Dg Chest Port 1 View 06/19/2015  1. Endotracheal tube tip 26 mm from the carina, in good position.  2. Enteric tube tip not visible.  3. No focal consolidation. Suggestion of pulmonary vascular congestion.   2D ehco - - Left ventricle: The cavity size was normal. Wall thickness was normal. Systolic function was normal. The  estimated ejection fraction was in the range of 60% to 65%. Wall motion was normal; there were no regional wall motion abnormalities. Doppler parameters are consistent with abnormal left ventricular relaxation (grade 1 diastolic dysfunction). - Mitral valve: Transvalvular velocity was within the normal range. There was no evidence for stenosis. There was no regurgitation. - Right ventricle: The cavity size was normal. Wall thickness was normal. Systolic function was normal. - Tricuspid valve: There was trivial regurgitation. - Pulmonary arteries: PA peak pressure: 40 mm Hg (S). - Inferior vena cava: The vessel was normal in size. The respirophasic diameter changes were blunted (< 50%), consistent with normal central venous pressure.  Renal artery [place] - Elevated velocities in the right renal artery are consistent with 60-99%  stenosis. Left renal artery appeared within normal limits. Results given to patient's nurse.     HISTORY OF PRESENT ILLNESS Katie Nichols is an 44 y.o. female hx of HTN, asthma presenting as code stroke with acute onset of right sided weakness and sensory deficits. LSW 1640 when she initially noted shortness of breath followed by weakness and numbness on her right side. Code stroke activated. On EMS arrival noted to have BP of 280/180 with HR in the 120s. Patient reports history of HTN but has not been to a doctor in years, on no antihypertensives. Not on any antiplatelet or anticoagulant.   CT head imaging reviewed shows left basal ganglia ICH with minimal intra-ventricular extension with around 3mm midline shift. No signs of early hydrocephalus. Initial NIHSS of 12 Date last known well: 9/26 Time last known well: 1630 tPA Given: No, ICH Modified Rankin: Rankin Score=0  ICH Score: 1   HOSPITAL COURSE She was admitted to the intensive care unit where she was initially intubated.  Critical care medicine help manage her ventilatory status. Her blood  pressure was difficult to control and required IV Cardene as well as S1. Repeat CT scan of the head showed stable appearance of the hematoma without significant hydrocephalus or increasing mass effect. She was extubated and did well after extubation. She continued to have persistent right hemiplegia and mild expressive language difficulties. Nephrology was consulted due to her refractory hypertension and diagnosis of renal artery stenosis. Vascular surgery was also consulted and recommended elective renal arteriogram to decide if she would benefit from renal angioplasty or stenting. Patient's condition gradually been stable and she was mobilize out of bed to the neurology floor where she remained stable. She was seen by rehabilitation medicine but felt not to be a candidate for inpatient rehabilitation hence social worker was involved and arranged for transfer for the patient to [facility] for rehabilitation in a nursing home setting. Patient was agreeable to the plan. She was started on oral medications for blood pressure which remained reasonably well controlled at the time of discharge. She was counseled to quit smoking cigarettes as well as marijuana and she was agreeable to this.  DISCHARGE EXAM Blood pressure 123/64, pulse 83, temperature 99.1 F (37.3 C), temperature source Oral, resp. rate 16, height  (1.626 m), weight 91.581 kg (201 lb 14.4 oz), last menstrual period 06/23/2015, SpO2 94 %. General - mildly obese, well developed, awake alert.  Ophthalmologic - Fundi not visualized due to noncooperation.  Cardiovascular - Regular rate and rhythm with no murmur.  Neuro - awake alert, able to follow all simple commands, with Mild transcortical motor aphasia. nonfluent speech.PERRL, EOMI, blinking to visual threat on the both sides, right facial droop, RUE 0/5 and RLE 1/5 on pain stimulation, right side decreased sensation with pain stimulation, LUE and LLE spontaneous  movement against gravity, babinski mute b/l, DTR 1+. No ataxia on the LUE  Discharge Diet   Diet Heart Room service appropriate?: Yes; Fluid consistency:: Thin liquids  DISCHARGE PLAN  Disposition:  [facility]   no antithrombotic for secondary stroke prevention.  Follow-up No primary care provider on file. in 2 weeks.  Follow-up with Dr. Marvel Plan , Stroke Clinic in 2 months.  were spent preparing discharge.  Delia Heady, MD Medical Director [facility] Pager: [phone] 06/29/2015 1:42 PM

## 2015-07-04 ENCOUNTER — Inpatient Hospital Stay (HOSPITAL_COMMUNITY): Payer: Self-pay

## 2015-07-10 ENCOUNTER — Other Ambulatory Visit
Admission: RE | Admit: 2015-07-10 | Discharge: 2015-07-10 | Disposition: A | Discharge status: Home / Self Care | Payer: Medicaid Other | Source: Skilled Nursing Facility | Attending: Family Medicine | Admitting: Family Medicine

## 2015-07-10 DIAGNOSIS — I639 Cerebral infarction, unspecified: Secondary | ICD-10-CM | POA: Diagnosis present

## 2015-07-10 DIAGNOSIS — I10 Essential (primary) hypertension: Secondary | ICD-10-CM | POA: Insufficient documentation

## 2015-07-10 LAB — BASIC METABOLIC PANEL
Anion gap: 8 (ref 5–15)
BUN: 53 mg/dL — ABNORMAL HIGH (ref 6–20)
CALCIUM: 9.2 mg/dL (ref 8.9–10.3)
CO2: 27 mmol/L (ref 22–32)
CREATININE: 1.31 mg/dL — AB (ref 0.44–1.00)
Chloride: 95 mmol/L — ABNORMAL LOW (ref 101–111)
GFR calc non Af Amer: 48 mL/min — ABNORMAL LOW (ref >60)
GFR, EST AFRICAN AMERICAN: 56 mL/min — AB (ref >60)
Glucose, Bld: 167 mg/dL — ABNORMAL HIGH (ref 65–99)
Potassium: 3.8 mmol/L (ref 3.5–5.1)
SODIUM: 130 mmol/L — AB (ref 135–145)

## 2015-08-10 ENCOUNTER — Encounter: Payer: Self-pay | Admitting: Neurology

## 2015-08-10 ENCOUNTER — Ambulatory Visit (INDEPENDENT_AMBULATORY_CARE_PROVIDER_SITE_OTHER): Payer: Self-pay | Admitting: Neurology

## 2015-08-10 VITALS — BP 130/70 | HR 70 | Ht 64.0 in | Wt 181.0 lb

## 2015-08-10 DIAGNOSIS — I151 Hypertension secondary to other renal disorders: Secondary | ICD-10-CM | POA: Insufficient documentation

## 2015-08-10 DIAGNOSIS — I701 Atherosclerosis of renal artery: Secondary | ICD-10-CM | POA: Insufficient documentation

## 2015-08-10 DIAGNOSIS — N2889 Other specified disorders of kidney and ureter: Secondary | ICD-10-CM

## 2015-08-10 DIAGNOSIS — I610 Nontraumatic intracerebral hemorrhage in hemisphere, subcortical: Secondary | ICD-10-CM

## 2015-08-10 NOTE — Patient Instructions (Addendum)
-   continue lipitor for stroke prevention - will repeat CT head to see the resolution of bleeding - will recommend to have nephrology referral as outpt for right renal artery stenosis management. - continue po BP meds for target BP < 140. - Follow up with your primary care physician for stroke risk factor modification. Recommend maintain blood pressure goal <140/80, diabetes with hemoglobin A1c goal below 6.5% and lipids with LDL cholesterol goal below 70 mg/dL.  - aggressive PT/OT - follow up in 3 months.

## 2015-08-10 NOTE — Progress Notes (Signed)
STROKE NEUROLOGY FOLLOW UP NOTE  NAME: Katie Nichols DOB: [birth year]  REASON FOR VISIT: stroke follow up HISTORY FROM: pt and chart  Today we had the pleasure of seeing Katie Nichols in follow-up at our Neurology Clinic. Pt was accompanied by facility staff.   History Summary Katie Nichols is a 44 y.o. female with history of hypertension and asthma presenting with right-sided weakness and sensory deficits with blood pressure 280/180. CT head showed left thalamic/BG hemorrhage likely due to uncontrolled HTN. Repeat CT showed stable hematoma and no hydrocephalus. Due to high BP, she was put on cardene drip. Gradually increased po meds and able to wean off cardene. 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 Vascular surgery and nephrology consulted, recommend out pt follow up. TTE and A1C negative. LDL 109. UDS showed THC positive. Pt BP eventually under control and she was discharged to SNF.   Interval History During the interval time, the patient has been doing much better. She worked with PT/OT and right arm and leg strength much better. BP started to be low with medications, so her amlodipine down from 10 to 5mg . Coreg down to 12.5mg  bid, hydralazine 25mg  bid, lisinopril 20mg  daily. BP in clinic 130/70. Complains of diplopia when look to upper right.   REVIEW OF SYSTEMS: Full 14 system review of systems performed and notable only for those listed below and in HPI above, all others are negative:  Constitutional:   Cardiovascular:  Ear/Nose/Throat:   Skin:  Eyes:  Double vision Respiratory:  wheezing Gastroitestinal:   Genitourinary:  Hematology/Lymphatic:   Endocrine:  Musculoskeletal:  Joint pain Allergy/Immunology:  Allergy  Neurological:  Memory loss, weakness Psychiatric:  Sleep:   The following represents the patient's updated allergies and side effects list: Allergies  Allergen Reactions  . Other     tomatoe    The neurologically relevant items on the patient's  problem list were reviewed on today's visit.  Neurologic Examination  A problem focused neurological exam (12 or more points of the single system neurologic examination, vital signs counts as 1 point, cranial nerves count for 8 points) was performed.  Blood pressure 130/70, pulse 70, height 5\' 4"  (1.626 m), weight 181 lb (82.101 kg), last menstrual period 07/10/2015.  General - Well nourished, well developed, in no apparent distress.  Ophthalmologic - Fundi not visualized due to noncooperation.  Cardiovascular - Regular rate and rhythm.  Mental Status -  Level of arousal and orientation to time, place, and person were intact. Language including expression, naming, repetition, comprehension was assessed and found intact. Fund of Knowledge was assessed and was intact.  Cranial Nerves II - XII - II - Visual field intact OU III, IV, VI - Extraocular movements intact. V - Facial sensation intact bilaterally. VII - Facial movement intact bilaterally. VIII - Hearing & vestibular intact bilaterally. X - Palate elevates symmetrically. XI - Chin turning & shoulder shrug intact bilaterally. XII - Tongue protrusion intact.  Motor Strength - The patient's strength was LUE proximal 3/5, distal 4/5, LLE proximal and distal 4+/5.  Bulk was normal and fasciculations were absent.   Motor Tone - Muscle tone was assessed at the neck and appendages and was normal  Reflexes - The patient's reflexes were 1+ in all extremities and she had no pathological reflexes.  Sensory - Light touch, temperature/pinprick and Romberg testing were assessed and were normal.    Coordination - The patient had normal movements in the hands and feet with no ataxia  or dysmetria.  Tremor was absent.  Gait and Station - in wheelchair, deferred testing.  Data reviewed: I personally reviewed the images and agree with the radiology interpretations.  Ct Head Wo Contrast  06/23/2015 IMPRESSION: Evolving LEFT thalamus  intraparenchymal hematoma, local mass-effect. Similar intraventricular blood products, mild hydrocephalus. Mild global parenchymal edema.   06/21/2015 IMPRESSION: 1. Stable size of left thalamic hematoma with similar localized vasogenic edema. 2. Overall slight interval decrease in intraventricular hemorrhage with stable mild dilatation of the temporal horns. 3. Stable 6 mm left-to-right shift at the level of the hematoma.   06/20/2015  1. Unchanged left thalamic hematoma.  2. Newly seen right lateral ventricle and interpeduncular fossa hemorrhage is likely redistribution. Overall, intraventricular hematoma volume is similar to yesterday. However, more prominent midline shift comparing with last CT head. 3. Stable mild dilation of the temporal horns.   06/19/2015  Acute LEFT basal ganglia hemorrhage with intraventricular breakthrough. No early hydrocephalus. 3 mm of LEFT-to- RIGHT midline shift.   Dg Chest Port 1 View 06/19/2015  1. Endotracheal tube tip 26 mm from the carina, in good position.  2. Enteric tube tip not visible.  3. No focal consolidation. Suggestion of pulmonary vascular congestion.   2D ehco - - Left ventricle: The cavity size was normal. Wall thickness was normal. Systolic function was normal. The estimated ejection fraction was in the range of 60% to 65%. Wall motion was normal; there were no regional wall motion abnormalities. Doppler parameters are consistent with abnormal left ventricular relaxation (grade 1 diastolic dysfunction). - Mitral valve: Transvalvular velocity was within the normal range. There was no evidence for stenosis. There was no regurgitation. - Right ventricle: The cavity size was normal. Wall thickness was normal. Systolic function was normal. - Tricuspid valve: There was trivial regurgitation. - Pulmonary arteries: PA peak pressure: 40 mm Hg (S). - Inferior vena cava: The vessel was normal in size.  The respirophasic diameter changes were blunted (< 50%), consistent with normal central venous pressure.  Renal artery [place] - Elevated velocities in the right renal artery are consistent with 60-99% stenosis. Left renal artery appeared within normal limits. Results given to patient's nurse.   Component     Latest Ref Rng 06/19/2015 06/20/2015  Cholesterol     0 - 200 mg/dL  604  Triglycerides     <150 mg/dL 540 (H) 69  HDL Cholesterol     >40 mg/dL  53  Total CHOL/HDL Ratio       3.3  VLDL     0 - 40 mg/dL  14  LDL (calc)     0 - 99 mg/dL  981 (H)  Hemoglobin X9J     4.8 - 5.6 % 5.6   Mean Plasma Glucose      114   TSH     0.350 - 4.500 uIU/mL  0.641    Assessment: As you may recall, she is a 44 y.o. Caucasian female with PMH of HTN not compliant with me was admitted on 06/19/15 for left BG/CR hemorrhage with ventricular extension likely due to uncontrolled HTN. Repeat CT showed stable hematoma and no hydrocephalus. 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 Vascular surgery and nephrology consulted, recommend out pt follow up. TTE and A1C negative. LDL 109. UDS showed THC positive. Pt BP eventually under control and she was discharged to SNF. During the interval time, the patient has been doing much better. She worked with PT/OT and right arm and leg strength much better. Will  repeat CT to document hemorrhage resolution.   Plan:  - continue lipitor for stroke prevention - repeat CT head  - nephrology referral as outpt for right renal artery stenosis management. - continue po BP meds for target BP < 140. - Follow up with your primary care physician for stroke risk factor modification. Recommend maintain blood pressure goal <140/80, diabetes with hemoglobin A1c goal below 6.5% and lipids with LDL cholesterol goal below 70 mg/dL.  - aggressive PT/OT - follow up in 3 months.  I spent more than 25 minutes of face to face time with the patient. Greater than 50% of time was spent in  counseling and coordination of care.   Orders Placed This Encounter  Procedures  . CT Head Wo Contrast    Standing Status: Future     Number of Occurrences:      Standing Expiration Date: 11/09/2016    Order Specific Question:  Reason for Exam (SYMPTOM  OR DIAGNOSIS REQUIRED)    Answer:  ICH follow up    Order Specific Question:  Is the patient pregnant?    Answer:  No    Order Specific Question:  Preferred imaging location?    Answer:  Internal  . Ambulatory referral to Nephrology    Referral Priority:  Routine    Referral Type:  Consultation    Referral Reason:  Specialty Services Required    Requested Specialty:  Nephrology    Number of Visits Requested:  1    No orders of the defined types were placed in this encounter.    Patient Instructions  - continue lipitor for stroke prevention - will repeat CT head to see the resolution of bleeding - will recommend to have nephrology referral as outpt for right renal artery stenosis management. - continue po BP meds for target BP < 140. - Follow up with your primary care physician for stroke risk factor modification. Recommend maintain blood pressure goal <140/80, diabetes with hemoglobin A1c goal below 6.5% and lipids with LDL cholesterol goal below 70 mg/dL.  - aggressive PT/OT - follow up in 3 months.     Marvel Plan, MD PhD [facility] [address], [address] [place], [place] [address] [phone]

## 2015-11-13 ENCOUNTER — Telehealth: Payer: Self-pay

## 2015-11-13 ENCOUNTER — Encounter: Payer: Self-pay | Admitting: Neurology

## 2015-11-13 ENCOUNTER — Ambulatory Visit (INDEPENDENT_AMBULATORY_CARE_PROVIDER_SITE_OTHER): Payer: Medicaid Other | Admitting: Neurology

## 2015-11-13 VITALS — BP 140/80 | HR 65 | Ht 64.0 in | Wt 183.6 lb

## 2015-11-13 DIAGNOSIS — I610 Nontraumatic intracerebral hemorrhage in hemisphere, subcortical: Secondary | ICD-10-CM

## 2015-11-13 DIAGNOSIS — N2889 Other specified disorders of kidney and ureter: Secondary | ICD-10-CM

## 2015-11-13 DIAGNOSIS — I151 Hypertension secondary to other renal disorders: Secondary | ICD-10-CM | POA: Diagnosis not present

## 2015-11-13 DIAGNOSIS — I701 Atherosclerosis of renal artery: Secondary | ICD-10-CM | POA: Diagnosis not present

## 2015-11-13 DIAGNOSIS — G8114 Spastic hemiplegia affecting left nondominant side: Secondary | ICD-10-CM

## 2015-11-13 DIAGNOSIS — G8194 Hemiplegia, unspecified affecting left nondominant side: Secondary | ICD-10-CM | POA: Insufficient documentation

## 2015-11-13 MED ORDER — ATORVASTATIN CALCIUM 10 MG PO TABS: 10.0000 mg | ORAL_TABLET | Freq: Every day | ORAL | Status: AC

## 2015-11-13 MED ORDER — ASPIRIN EC 81 MG PO TBEC: 81.0000 mg | DELAYED_RELEASE_TABLET | Freq: Every day | ORAL | Status: DC

## 2015-11-13 NOTE — Patient Instructions (Signed)
-   start ASA  for stroke prevention - continue lipitor for stroke prevention - will refer you to home health for physical therapy - will refer you to vascular surgery for renal artery stenosis - will refer you to Dr. Wynn Banker for right arm spasticity and limited should ROM - continue home exercise. - check BP at home and compliance with BP meds - Follow up with your primary care physician for stroke risk factor modification. Recommend maintain blood pressure goal <130/80, diabetes with hemoglobin A1c goal below 6.5% and lipids with LDL cholesterol goal below 70 mg/dL.  - follow up in 3 months.

## 2015-11-13 NOTE — Progress Notes (Signed)
STROKE NEUROLOGY FOLLOW UP NOTE  NAME: Katie Nichols DOB: Oct 13, 1970  REASON FOR VISIT: stroke follow up HISTORY FROM: pt and chart  Today we had the pleasure of seeing Katie Nichols in follow-up at our Neurology Clinic. Pt was accompanied by facility staff.   History Summary Ms. CARLTON SWEANEY is a 45 y.o. female with history of hypertension and asthma presenting with right-sided weakness and sensory deficits with blood pressure 280/180. CT head showed left thalamic/BG hemorrhage likely due to uncontrolled HTN. Repeat CT showed stable hematoma and no hydrocephalus. Due to high BP, she was put on cardene drip. Gradually increased po meds and able to wean off cardene. 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 Vascular surgery and nephrology consulted, recommend out pt follow up. TTE and A1C negative. LDL 109. UDS showed THC positive. Pt BP eventually under control and she was discharged to SNF.   Follow up 08/10/15 - the patient has been doing much better. She worked with PT/OT and right arm and leg strength much better. BP started to be low with medications, so her amlodipine down from 10 to 5mg . Coreg down to 12.5mg  bid, hydralazine 25mg  bid, lisinopril 20mg  daily. BP in clinic 130/70. Complains of diplopia when look to upper right.   Interval History During the interval time, pt has been doing better, off wheelchair now, able to walk with walker. She has been at home since d/c for NH. Still has right sided hemiparesis. Right should limited ROM with pain. Walk with walker. Currently, no therapy at home or as outpt. CT head not done yet. Has not following with vascular surgery yet for RAS. So far BP under control with 4 meds. Today 140/80.  REVIEW OF SYSTEMS: Full 14 system review of systems performed and notable only for those listed below and in HPI above, all others are negative:  Constitutional:  Activity change, appetite change.Cardiovascular:  Ear/Nose/Throat:   Skin:  Eyes:  Double  vision Respiratory:  Wheezing, cough, SOB Gastroitestinal:   Genitourinary:  Hematology/Lymphatic:   Endocrine:  Musculoskeletal:   Allergy/Immunology:  Neurological:  Walking difficulty Psychiatric:  Sleep:   The following represents the patient's updated allergies and side effects list: Allergies  Allergen Reactions  . Other     tomatoe    The neurologically relevant items on the patient's problem list were reviewed on today's visit.  Neurologic Examination  A problem focused neurological exam (12 or more points of the single system neurologic examination, vital signs counts as 1 point, cranial nerves count for 8 points) was performed.  Blood pressure 140/80, pulse 65, height 5\' 4"  (1.626 m), weight 183 lb 9.6 oz (83.28 kg), last menstrual period 10/27/2015.  General - Well nourished, well developed, in no apparent distress.  Ophthalmologic - Fundi not visualized due to noncooperation.  Cardiovascular - Regular rate and rhythm.  Mental Status -  Level of arousal and orientation to time, place, and person were intact. Language including expression, naming, repetition, comprehension was assessed and found intact. Fund of Knowledge was assessed and was intact.  Cranial Nerves II - XII - II - Visual field intact OU III, IV, VI - Extraocular movements intact. V - Facial sensation intact bilaterally. VII - Facial movement intact bilaterally. VIII - Hearing & vestibular intact bilaterally. X - Palate elevates symmetrically. XI - Chin turning & shoulder shrug intact bilaterally. XII - Tongue protrusion intact.  Motor Strength - The patient's strength was LUE proximal 3/5, distal 4/5, LLE proximal and distal  4+/5.  Bulk was normal and fasciculations were absent.   Motor Tone - Muscle tone was assessed at the neck and appendages and was normal  Reflexes - The patient's reflexes were 1+ in all extremities and she had no pathological reflexes.  Sensory - Light touch,  temperature/pinprick and Romberg testing were assessed and were normal.    Coordination - The patient had normal movements in the hands and feet with no ataxia or dysmetria.  Tremor was absent.  Gait and Station - walk with walker, slow, right hemiparetic gait .  Data reviewed: I personally reviewed the images and agree with the radiology interpretations.  Ct Head Wo Contrast  06/23/2015 IMPRESSION: Evolving LEFT thalamus intraparenchymal hematoma, local mass-effect. Similar intraventricular blood products, mild hydrocephalus. Mild global parenchymal edema.   06/21/2015 IMPRESSION: 1. Stable size of left thalamic hematoma with similar localized vasogenic edema. 2. Overall slight interval decrease in intraventricular hemorrhage with stable mild dilatation of the temporal horns. 3. Stable 6 mm left-to-right shift at the level of the hematoma.   06/20/2015  1. Unchanged left thalamic hematoma.  2. Newly seen right lateral ventricle and interpeduncular fossa hemorrhage is likely redistribution. Overall, intraventricular hematoma volume is similar to yesterday. However, more prominent midline shift comparing with last CT head. 3. Stable mild dilation of the temporal horns.   06/19/2015  Acute LEFT basal ganglia hemorrhage with intraventricular breakthrough. No early hydrocephalus. 3 mm of LEFT-to- RIGHT midline shift.   Dg Chest Port 1 View 06/19/2015  1. Endotracheal tube tip 26 mm from the carina, in good position.  2. Enteric tube tip not visible.  3. No focal consolidation. Suggestion of pulmonary vascular congestion.   2D ehco - - Left ventricle: The cavity size was normal. Wall thickness was normal. Systolic function was normal. The estimated ejection fraction was in the range of 60% to 65%. Wall motion was normal; there were no regional wall motion abnormalities. Doppler parameters are consistent with abnormal left ventricular relaxation (grade 1  diastolic dysfunction). - Mitral valve: Transvalvular velocity was within the normal range. There was no evidence for stenosis. There was no regurgitation. - Right ventricle: The cavity size was normal. Wall thickness was normal. Systolic function was normal. - Tricuspid valve: There was trivial regurgitation. - Pulmonary arteries: PA peak pressure: 40 mm Hg (S). - Inferior vena cava: The vessel was normal in size. The respirophasic diameter changes were blunted (< 50%), consistent with normal central venous pressure.  Renal artery [place] - Elevated velocities in the right renal artery are consistent with 60-99% stenosis. Left renal artery appeared within normal limits. Results given to patient's nurse.   Component     Latest Ref Rng 06/19/2015 06/20/2015  Cholesterol     0 - 200 mg/dL  161  Triglycerides     <150 mg/dL 096 (H) 69  HDL Cholesterol     >40 mg/dL  53  Total CHOL/HDL Ratio       3.3  VLDL     0 - 40 mg/dL  14  LDL (calc)     0 - 99 mg/dL  045 (H)  Hemoglobin W0J     4.8 - 5.6 % 5.6   Mean Plasma Glucose      114   TSH     0.350 - 4.500 uIU/mL  0.641    Assessment: As you may recall, she is a 45 y.o. Caucasian female with PMH of HTN not compliant with meds was admitted on 06/19/15 for left BG/CR  hemorrhage with ventricular extension likely due to uncontrolled HTN. Repeat CT showed stable hematoma and no hydrocephalus. 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 Vascular surgery and nephrology consulted, recommend out pt follow up. TTE and A1C negative. LDL 109. UDS showed THC positive. Pt BP eventually under control and she was discharged to SNF. During the interval time, the patient has been doing much better, at home after rehab in [facility]. Able to walk with walker now. CT head not done yet. Has not seen by vascular surgery yet. On several BP meds, BP stable so far.  Plan:  - start ASA 81mg  for stroke prevention - continue lipitor for stroke prevention - will refer  to home health for PT at home - will refer to vascular surgery for RAS - will refer to Dr. Wynn Banker for right sided spasticity and limited shoulder ROM - continue home exercise. - check BP at home and compliance with BP meds - Follow up with your primary care physician for stroke risk factor modification. Recommend maintain blood pressure goal 130/80, diabetes with hemoglobin A1c goal below 6.5% and lipids with LDL cholesterol goal below 70 mg/dL.  - follow up in 3 months.  I spent more than 25 minutes of face to face time with the patient. Greater than 50% of time was spent in counseling and coordination of care.   Orders Placed This Encounter  Procedures  . Ambulatory referral to Physical Medicine Rehab    Referral Priority:  Routine    Referral Type:  Rehabilitation    Referral Reason:  Specialty Services Required    Referred to Provider:  Erick Colace, MD    Requested Specialty:  Physical Medicine and Rehabilitation    Number of Visits Requested:  1  . Ambulatory referral to Vascular Surgery    Referral Priority:  Routine    Referral Type:  Surgical    Referral Reason:  Specialty Services Required    Requested Specialty:  Vascular Surgery    Number of Visits Requested:  1  . Ambulatory referral to Home Health    Referral Priority:  Routine    Referral Type:  Home Health Care    Referral Reason:  Specialty Services Required    Requested Specialty:  Home Health Services    Number of Visits Requested:  1    Meds ordered this encounter  Medications  . aspirin EC 81 MG tablet    Sig: Take 1 tablet (81 mg total) by mouth daily.    Dispense:  90 tablet    Refill:  3  . atorvastatin (LIPITOR) 10 MG tablet    Sig: Take 1 tablet (10 mg total) by mouth daily at 6 PM.    Dispense:  90 tablet    Refill:  3    Patient Instructions  - start ASA 81mg  for stroke prevention - continue lipitor for stroke prevention - will refer you to home health for physical therapy - will  refer you to vascular surgery for renal artery stenosis - will refer you to Dr. Wynn Banker for right arm spasticity and limited should ROM - continue home exercise. - check BP at home and compliance with BP meds - Follow up with your primary care physician for stroke risk factor modification. Recommend maintain blood pressure goal <130/80, diabetes with hemoglobin A1c goal below 6.5% and lipids with LDL cholesterol goal below 70 mg/dL.  - follow up in 3 months.     Marvel Plan, MD PhD [facility] [phone] 3rd  [address] [place], [place] [address] [phone]  ds

## 2015-11-13 NOTE — Telephone Encounter (Signed)
Rn fax Lipitor prescription to [facility] on [address]. Fax and receive.

## 2015-11-27 ENCOUNTER — Encounter: Payer: Self-pay | Admitting: Vascular Surgery

## 2015-11-30 ENCOUNTER — Telehealth: Payer: Self-pay | Admitting: Neurology

## 2015-11-30 ENCOUNTER — Encounter: Payer: Medicaid Other | Admitting: Vascular Surgery

## 2015-11-30 NOTE — Telephone Encounter (Signed)
Katie Nichols with [facility] [phone] called, said pt was a no show for appt today. FYI only

## 2015-11-30 NOTE — Telephone Encounter (Signed)
Message sent to Dr. Roda ShuttersXu that patient was a no show for her vascular vein appt today for RAS.

## 2016-01-11 ENCOUNTER — Ambulatory Visit: Payer: Medicaid Other | Admitting: Neurology

## 2016-02-06 ENCOUNTER — Ambulatory Visit: Payer: Medicaid Other | Admitting: Neurology

## 2016-04-15 ENCOUNTER — Ambulatory Visit (INDEPENDENT_AMBULATORY_CARE_PROVIDER_SITE_OTHER): Payer: Medicaid Other | Admitting: Neurology

## 2016-04-15 ENCOUNTER — Encounter: Payer: Self-pay | Admitting: Neurology

## 2016-04-15 VITALS — BP 165/97 | HR 76 | Ht 64.0 in | Wt 196.0 lb

## 2016-04-15 DIAGNOSIS — N2889 Other specified disorders of kidney and ureter: Secondary | ICD-10-CM | POA: Diagnosis not present

## 2016-04-15 DIAGNOSIS — G8111 Spastic hemiplegia affecting right dominant side: Secondary | ICD-10-CM | POA: Diagnosis not present

## 2016-04-15 DIAGNOSIS — I701 Atherosclerosis of renal artery: Secondary | ICD-10-CM | POA: Diagnosis not present

## 2016-04-15 DIAGNOSIS — I151 Hypertension secondary to other renal disorders: Secondary | ICD-10-CM | POA: Diagnosis not present

## 2016-04-15 DIAGNOSIS — E785 Hyperlipidemia, unspecified: Secondary | ICD-10-CM | POA: Diagnosis not present

## 2016-04-15 DIAGNOSIS — I610 Nontraumatic intracerebral hemorrhage in hemisphere, subcortical: Secondary | ICD-10-CM | POA: Diagnosis not present

## 2016-04-15 NOTE — Patient Instructions (Addendum)
-   continue ASA and lipitor for stroke prevention - will send records to social security office. - continue home exercise. - check BP at home and compliance with BP meds - Follow up with your primary care physician for stroke risk factor modification. Recommend maintain blood pressure goal 130/80, diabetes with hemoglobin A1c goal below 6.5% and lipids with LDL cholesterol goal below 70 mg/dL.  - follow up in 6 months.

## 2016-04-16 ENCOUNTER — Encounter: Payer: Self-pay | Admitting: Neurology

## 2016-04-16 DIAGNOSIS — G8111 Spastic hemiplegia affecting right dominant side: Secondary | ICD-10-CM | POA: Insufficient documentation

## 2016-04-16 DIAGNOSIS — I610 Nontraumatic intracerebral hemorrhage in hemisphere, subcortical: Secondary | ICD-10-CM | POA: Insufficient documentation

## 2016-04-16 DIAGNOSIS — E785 Hyperlipidemia, unspecified: Secondary | ICD-10-CM | POA: Insufficient documentation

## 2016-04-16 NOTE — Progress Notes (Signed)
STROKE NEUROLOGY FOLLOW UP NOTE  NAME: Katie Nichols DOB: May 15, 1971  REASON FOR VISIT: stroke follow up HISTORY FROM: pt and chart  Today we had the pleasure of seeing Katie Nichols in follow-up at our Neurology Clinic. Pt was accompanied by no one.   History Summary Ms. Katie Nichols is a 45 y.o. female with history of hypertension and asthma presenting with right-sided weakness and sensory deficits with blood pressure 280/180. CT head showed left thalamic/BG hemorrhage likely due to uncontrolled HTN. Repeat CT showed stable hematoma and no hydrocephalus. Due to high BP, she was put on cardene drip. Gradually increased po meds and able to wean off cardene. 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 Vascular surgery and nephrology consulted, recommend out pt follow up. TTE and A1C negative. LDL 109. UDS showed THC positive. Pt BP eventually under control and she was discharged to SNF.   Follow up 08/10/15 - the patient has been doing much better. She worked with PT/OT and right arm and leg strength much better. BP started to be low with medications, so her amlodipine down from 10 to 5mg . Coreg down to 12.5mg  bid, hydralazine 25mg  bid, lisinopril 20mg  daily. BP in clinic 130/70. Complains of diplopia when look to upper right.   Follow up 11/13/15 - pt has been doing better, off wheelchair now, able to walk with walker. She has been at home since d/c for NH. Still has right sided hemiparesis. Right should limited ROM with pain. Walk with walker. Currently, no therapy at home or as outpt. CT head not done yet. Has not following with vascular surgery yet for RAS. So far BP under control with 4 meds. Today 140/80.  Interval History During the interval time, pt has been doing the same. Still has right hemiparesis. Right should seems better than last visit, no more pain and able to raise over head. Still walk with walker. Not able to drive. Double vision more prominent on the right gaze. She is applying  for disability now. BP today 164/97 but at home she said SBP always 130s. She refused stenting from RAS and refused further injection at right shoulder. She has had no home PT and she is not able to transport easily for outpt PT. She is doing home exercise.   REVIEW OF SYSTEMS: Full 14 system review of systems performed and notable only for those listed below and in HPI above, all others are negative:  Constitutional:  Activity change, appetite change.Cardiovascular:  Ear/Nose/Throat:   Skin:  Eyes:  Double vision Respiratory:   Gastroitestinal:   Genitourinary:  Hematology/Lymphatic:   Endocrine:  Musculoskeletal:   Allergy/Immunology: Env allergies Neurological:  Walking difficulty Psychiatric:  Sleep:   The following represents the patient's updated allergies and side effects list: Allergies  Allergen Reactions  . Sulfur Shortness Of Breath  . Other     tomatoe    The neurologically relevant items on the patient's problem list were reviewed on today's visit.  Neurologic Examination  A problem focused neurological exam (12 or more points of the single system neurologic examination, vital signs counts as 1 point, cranial nerves count for 8 points) was performed.  Blood pressure (!) 165/97, pulse 76, height 5\' 4"  (1.626 m), weight 196 lb (88.9 kg).  General - Well nourished, well developed, in no apparent distress.  Ophthalmologic - Fundi not visualized due to noncooperation.  Cardiovascular - Regular rate and rhythm.  Mental Status -  Level of arousal and orientation to time,  place, and person were intact. Language including expression, naming, repetition, comprehension was assessed and found intact. Fund of Knowledge was assessed and was intact.  Cranial Nerves II - XII - II - Visual field intact OU III, IV, VI - Extraocular movements slight disconjugation with right abduction weakness. V - Facial sensation intact bilaterally. VII - Facial movement intact  bilaterally. VIII - Hearing & vestibular intact bilaterally. X - Palate elevates symmetrically. XI - Chin turning & shoulder shrug intact bilaterally. XII - Tongue protrusion intact.  Motor Strength - The patient's strength was LUE proximal 3+/5, distal 4+/5, LLE proximal and distal 4+/5.  Bulk was normal and fasciculations were absent.   Motor Tone - Muscle tone was assessed at the neck and appendages and was increased on the right UE and LE.   Reflexes - The patient's reflexes were 1+ in all extremities and she had no pathological reflexes.  Sensory - Light touch, temperature/pinprick and Romberg testing were assessed and were normal.    Coordination - The patient had normal movements in the hands and feet with no ataxia or dysmetria.  Tremor was absent.  Gait and Station - walk with walker, slow, significant right hemiparetic gait .  Data reviewed: I personally reviewed the images and agree with the radiology interpretations.  Ct Head Wo Contrast  06/23/2015 IMPRESSION: Evolving LEFT thalamus intraparenchymal hematoma, local mass-effect. Similar intraventricular blood products, mild hydrocephalus. Mild global parenchymal edema.   06/21/2015 IMPRESSION: 1. Stable size of left thalamic hematoma with similar localized vasogenic edema. 2. Overall slight interval decrease in intraventricular hemorrhage with stable mild dilatation of the temporal horns. 3. Stable 6 mm left-to-right shift at the level of the hematoma.   06/20/2015  1. Unchanged left thalamic hematoma.  2. Newly seen right lateral ventricle and interpeduncular fossa hemorrhage is likely redistribution. Overall, intraventricular hematoma volume is similar to yesterday. However, more prominent midline shift comparing with last CT head. 3. Stable mild dilation of the temporal horns.   06/19/2015  Acute LEFT basal ganglia hemorrhage with intraventricular breakthrough. No early hydrocephalus. 3 mm of LEFT-to- RIGHT  midline shift.   Dg Chest Port 1 View 06/19/2015  1. Endotracheal tube tip 26 mm from the carina, in good position.  2. Enteric tube tip not visible.  3. No focal consolidation. Suggestion of pulmonary vascular congestion.   2D ehco - - Left ventricle: The cavity size was normal. Wall thickness was normal. Systolic function was normal. The estimated ejection fraction was in the range of 60% to 65%. Wall motion was normal; there were no regional wall motion abnormalities. Doppler parameters are consistent with abnormal left ventricular relaxation (grade 1 diastolic dysfunction). - Mitral valve: Transvalvular velocity was within the normal range. There was no evidence for stenosis. There was no regurgitation. - Right ventricle: The cavity size was normal. Wall thickness was normal. Systolic function was normal. - Tricuspid valve: There was trivial regurgitation. - Pulmonary arteries: PA peak pressure: 40 mm Hg (S). - Inferior vena cava: The vessel was normal in size. The respirophasic diameter changes were blunted (< 50%), consistent with normal central venous pressure.  Renal artery [place] - Elevated velocities in the right renal artery are consistent with 60-99% stenosis. Left renal artery appeared within normal limits. Results given to patient's nurse.   Component     Latest Ref Rng 06/19/2015 06/20/2015  Cholesterol     0 - 200 mg/dL  161  Triglycerides     <150 mg/dL 096 (H) 69  HDL  Cholesterol     >40 mg/dL  53  Total CHOL/HDL Ratio       3.3  VLDL     0 - 40 mg/dL  14  LDL (calc)     0 - 99 mg/dL  161 (H)  Hemoglobin W9U     4.8 - 5.6 % 5.6   Mean Plasma Glucose      114   TSH     0.350 - 4.500 uIU/mL  0.641    Assessment: As you may recall, she is a 45 y.o. Caucasian female with PMH of HTN not compliant with meds was admitted on 06/19/15 for left BG/CR hemorrhage with ventricular extension likely due to uncontrolled HTN. Repeat CT showed stable  hematoma and no hydrocephalus. 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 Vascular surgery and nephrology consulted, recommend out pt follow up. TTE and A1C negative. LDL 109. UDS showed THC positive. Pt BP eventually under control and she was discharged to SNF. During the interval time, the patient has been doing better, at home after rehab in [facility]. Able to walk with walker but significant hemiparetic gait on the right. Has not seen by vascular surgery as she refused renal artery stenting. Denied for home PT but not able to have transport for outpt PT. Right should pain s/p injection but refused further injection. On several BP meds, BP stable at home so far.  Plan:  - continue ASA and lipitor for stroke prevention - continue home exercise. - check BP at home and compliance with BP meds - Follow up with your primary care physician for stroke risk factor modification. Recommend maintain blood pressure goal 130/80, diabetes with hemoglobin A1c goal below 6.5% and lipids with LDL cholesterol goal below 70 mg/dL.  - follow up in 6 months.  I spent more than 25 minutes of face to face time with the patient. Greater than 50% of time was spent in counseling and coordination of care.   No orders of the defined types were placed in this encounter.   No orders of the defined types were placed in this encounter.   Patient Instructions  - continue ASA and lipitor for stroke prevention - will send records to social security office. - continue home exercise. - check BP at home and compliance with BP meds - Follow up with your primary care physician for stroke risk factor modification. Recommend maintain blood pressure goal 130/80, diabetes with hemoglobin A1c goal below 6.5% and lipids with LDL cholesterol goal below 70 mg/dL.  - follow up in 6 months.   Marvel Plan, MD PhD [facility] [address], [address] [place], [place] [address] [phone]  ds

## 2016-05-30 ENCOUNTER — Telehealth: Payer: Self-pay | Admitting: Neurology

## 2016-05-30 ENCOUNTER — Telehealth: Payer: Self-pay | Admitting: *Deleted

## 2016-05-30 NOTE — Telephone Encounter (Signed)
[facility]

## 2016-05-31 NOTE — Telephone Encounter (Signed)
error 

## 2016-10-16 ENCOUNTER — Encounter: Payer: Self-pay | Admitting: Neurology

## 2016-10-16 ENCOUNTER — Ambulatory Visit (INDEPENDENT_AMBULATORY_CARE_PROVIDER_SITE_OTHER): Payer: Medicaid Other | Admitting: Neurology

## 2016-10-16 VITALS — BP 127/90 | HR 80 | Wt 208.6 lb

## 2016-10-16 DIAGNOSIS — I701 Atherosclerosis of renal artery: Secondary | ICD-10-CM | POA: Diagnosis not present

## 2016-10-16 DIAGNOSIS — N2889 Other specified disorders of kidney and ureter: Secondary | ICD-10-CM

## 2016-10-16 DIAGNOSIS — G8111 Spastic hemiplegia affecting right dominant side: Secondary | ICD-10-CM

## 2016-10-16 DIAGNOSIS — I151 Hypertension secondary to other renal disorders: Secondary | ICD-10-CM

## 2016-10-16 DIAGNOSIS — I610 Nontraumatic intracerebral hemorrhage in hemisphere, subcortical: Secondary | ICD-10-CM | POA: Diagnosis not present

## 2016-10-16 NOTE — Patient Instructions (Addendum)
-   continue ASA and lipitor for stroke prevention - continue home exercise. - check BP at home and compliance with BP meds - Follow up with your primary care physician for stroke risk factor modification. Recommend maintain blood pressure goal 130/80, diabetes with hemoglobin A1c goal below 6.5% and lipids with LDL cholesterol goal below 70 mg/dL.  - check blood tests today for renal function. Will let you know the result - follow up with PCP for kidney function monitoring - follow up as needed.

## 2016-10-16 NOTE — Progress Notes (Signed)
STROKE NEUROLOGY FOLLOW UP NOTE  NAME: Katie Nichols DOB: 14-Jan-1971  REASON FOR VISIT: stroke follow up HISTORY FROM: pt and chart  Today we had the pleasure of seeing Katie Nichols in follow-up at our Neurology Clinic. Pt was accompanied by no one.   History Summary Ms. OLETA GUNNOE is a 46 y.o. female with history of hypertension and asthma presenting with right-sided weakness and sensory deficits with blood pressure 280/180. CT head showed left thalamic/BG hemorrhage likely due to uncontrolled HTN. Repeat CT showed stable hematoma and no hydrocephalus. Due to high BP, she was put on cardene drip. Gradually increased po meds and able to wean off cardene. 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 Vascular surgery and nephrology consulted, recommend out pt follow up. TTE and A1C negative. LDL 109. UDS showed THC positive. Pt BP eventually under control and she was discharged to SNF.   Follow up 08/10/15 - the patient has been doing much better. She worked with PT/OT and right arm and leg strength much better. BP started to be low with medications, so her amlodipine down from 10 to 5mg . Coreg down to 12.5mg  bid, hydralazine 25mg  bid, lisinopril 20mg  daily. BP in clinic 130/70. Complains of diplopia when look to upper right.   Follow up 11/13/15 - pt has been doing better, off wheelchair now, able to walk with walker. She has been at home since d/c for NH. Still has right sided hemiparesis. Right should limited ROM with pain. Walk with walker. Currently, no therapy at home or as outpt. CT head not done yet. Has not following with vascular surgery yet for RAS. So far BP under control with 4 meds. Today 140/80.  04/15/16 follow up - pt has been doing the same. Still has right hemiparesis. Right should seems better than last visit, no more pain and able to raise over head. Still walk with walker. Not able to drive. Double vision more prominent on the right gaze. She is applying for disability now. BP  today 164/97 but at home she said SBP always 130s. She refused stenting from RAS and refused further injection at right shoulder. She has had no home PT and she is not able to transport easily for outpt PT. She is doing home exercise.  Interval History During the interval time, patient has been doing the same. Able to drive once in short distance. BP stable, today 127/90, still on multiple BP meds. Followed with PCP, however has not had blood test for renal function for some time. Able to walk with cane, but still uses walker for long distance. No other complaints.  REVIEW OF SYSTEMS: Full 14 system review of systems performed and notable only for those listed below and in HPI above, all others are negative:  Constitutional:  Activity change, appetite change. Cardiovascular:  Ear/Nose/Throat:   Skin:  Eyes:  Respiratory:  Wheezing Gastroitestinal:   Genitourinary:  Hematology/Lymphatic:   Endocrine:  Musculoskeletal:   Allergy/Immunology: Env allergies Neurological:  Numbness Psychiatric:  Sleep:   The following represents the patient's updated allergies and side effects list: Allergies  Allergen Reactions  . Sulfur Shortness Of Breath  . Other     tomatoe    The neurologically relevant items on the patient's problem list were reviewed on today's visit.  Neurologic Examination  A problem focused neurological exam (12 or more points of the single system neurologic examination, vital signs counts as 1 point, cranial nerves count for 8 points) was performed.  Blood  pressure 127/90, pulse 80, weight 208 lb 9.6 oz (94.6 kg).  General - Well nourished, well developed, in no apparent distress.  Ophthalmologic - Fundi not visualized due to noncooperation.  Cardiovascular - Regular rate and rhythm.  Mental Status -  Level of arousal and orientation to time, place, and person were intact. Language including expression, naming, repetition, comprehension was assessed and found  intact. Fund of Knowledge was assessed and was intact.  Cranial Nerves II - XII - II - Visual field intact OU III, IV, VI - Extraocular movements slight disconjugation with right abduction weakness. V - Facial sensation intact bilaterally. VII - Facial movement intact bilaterally. VIII - Hearing & vestibular intact bilaterally. X - Palate elevates symmetrically. XI - Chin turning & shoulder shrug intact bilaterally. XII - Tongue protrusion intact.  Motor Strength - The patient's strength was LUE proximal 3+/5, distal 4+/5, LLE proximal and distal 4+/5.  Bulk was normal and fasciculations were absent.   Motor Tone - Muscle tone was assessed at the neck and appendages and was increased on the right UE and LE.   Reflexes - The patient's reflexes were 1+ in all extremities and she had no pathological reflexes.  Sensory - Light touch, temperature/pinprick and Romberg testing were assessed and were normal.    Coordination - The patient had normal movements in the hands and feet with no ataxia or dysmetria.  Tremor was absent.  Gait and Station - walk with cane, slow, right hemiparetic gait .  Data reviewed: I personally reviewed the images and agree with the radiology interpretations.  Ct Head Wo Contrast  06/23/2015 IMPRESSION: Evolving LEFT thalamus intraparenchymal hematoma, local mass-effect. Similar intraventricular blood products, mild hydrocephalus. Mild global parenchymal edema.   06/21/2015 IMPRESSION: 1. Stable size of left thalamic hematoma with similar localized vasogenic edema. 2. Overall slight interval decrease in intraventricular hemorrhage with stable mild dilatation of the temporal horns. 3. Stable 6 mm left-to-right shift at the level of the hematoma.   06/20/2015  1. Unchanged left thalamic hematoma.  2. Newly seen right lateral ventricle and interpeduncular fossa hemorrhage is likely redistribution. Overall, intraventricular hematoma volume is similar to  yesterday. However, more prominent midline shift comparing with last CT head. 3. Stable mild dilation of the temporal horns.   06/19/2015  Acute LEFT basal ganglia hemorrhage with intraventricular breakthrough. No early hydrocephalus. 3 mm of LEFT-to- RIGHT midline shift.   Dg Chest Port 1 View 06/19/2015  1. Endotracheal tube tip 26 mm from the carina, in good position.  2. Enteric tube tip not visible.  3. No focal consolidation. Suggestion of pulmonary vascular congestion.   2D ehco - - Left ventricle: The cavity size was normal. Wall thickness was normal. Systolic function was normal. The estimated ejection fraction was in the range of 60% to 65%. Wall motion was normal; there were no regional wall motion abnormalities. Doppler parameters are consistent with abnormal left ventricular relaxation (grade 1 diastolic dysfunction). - Mitral valve: Transvalvular velocity was within the normal range. There was no evidence for stenosis. There was no regurgitation. - Right ventricle: The cavity size was normal. Wall thickness was normal. Systolic function was normal. - Tricuspid valve: There was trivial regurgitation. - Pulmonary arteries: PA peak pressure: 40 mm Hg (S). - Inferior vena cava: The vessel was normal in size. The respirophasic diameter changes were blunted (< 50%), consistent with normal central venous pressure.  Renal artery US - Elevated velocities in the right renal artery are consistent with 60-99% stenosis.  Left renal artery appeared within normal limits. Results given to patient's nurse.   Component     Latest Ref Rng 06/19/2015 06/20/2015  Cholesterol     0 - 200 mg/dL  161  Triglycerides     <150 mg/dL 096 (H) 69  HDL Cholesterol     >40 mg/dL  53  Total CHOL/HDL Ratio       3.3  VLDL     0 - 40 mg/dL  14  LDL (calc)     0 - 99 mg/dL  045 (H)  Hemoglobin W0J     4.8 - 5.6 % 5.6   Mean Plasma Glucose      114   TSH     0.350  - 4.500 uIU/mL  0.641    Assessment: As you may recall, she is a 46 y.o. Caucasian female with PMH of HTN not compliant with meds was admitted on 06/19/15 for left BG/CR hemorrhage with ventricular extension likely due to uncontrolled HTN. Repeat CT showed stable hematoma and no hydrocephalus. 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 Vascular surgery and nephrology consulted, recommend out pt follow up. TTE and A1C negative. LDL 109. UDS showed THC positive. Pt BP eventually under control and she was discharged to SNF. During the interval time, the patient has been doing better, at home after rehab in [facility]. Able to walk with walker but significant hemiparetic gait on the right. Has not seen by vascular surgery as she refused renal artery stenting. Denied for home PT but not able to have transport for outpt PT. Right should pain s/p injection but refused further injection. On several BP meds, BP stable. Able to walk with cane now. Will check renal function due to renal artery stenosis.  Plan:  - continue ASA and lipitor for stroke prevention - continue home exercise. - check BP at home and compliance with BP meds - Follow up with your primary care physician for stroke risk factor modification. Recommend maintain blood pressure goal 130/80, diabetes with hemoglobin A1c goal below 6.5% and lipids with LDL cholesterol goal below 70 mg/dL.  - check blood tests today for renal function. Will let you know the result - follow up with PCP for kidney function monitoring - follow up as needed.  I spent more than 25 minutes of face to face time with the patient. Greater than 50% of time was spent in counseling and coordination of care.   Orders Placed This Encounter  Procedures  . Basic metabolic panel  . CBC (no diff)    No orders of the defined types were placed in this encounter.   Patient Instructions  - continue ASA and lipitor for stroke prevention - continue home exercise. - check BP at home  and compliance with BP meds - Follow up with your primary care physician for stroke risk factor modification. Recommend maintain blood pressure goal 130/80, diabetes with hemoglobin A1c goal below 6.5% and lipids with LDL cholesterol goal below 70 mg/dL.  - check blood tests today for renal function. Will let you know the result - follow up with PCP for kidney function monitoring - follow up as needed.   Marvel Plan, MD PhD [facility] [address], [address] [place], [place] [address] [phone]

## 2016-10-17 LAB — BASIC METABOLIC PANEL
BUN/Creatinine Ratio: 19 (ref 9–23)
BUN: 14 mg/dL (ref 6–24)
CO2: 17 mmol/L — AB (ref 18–29)
Calcium: 9.2 mg/dL (ref 8.7–10.2)
Chloride: 105 mmol/L (ref 96–106)
Creatinine, Ser: 0.75 mg/dL (ref 0.57–1.00)
GFR calc Af Amer: 110 mL/min/{1.73_m2} (ref >59)
GFR, EST NON AFRICAN AMERICAN: 95 mL/min/{1.73_m2} (ref >59)
GLUCOSE: 93 mg/dL (ref 65–99)
POTASSIUM: 4.2 mmol/L (ref 3.5–5.2)
SODIUM: 142 mmol/L (ref 134–144)

## 2016-10-17 LAB — CBC
Hematocrit: 40.1 % (ref 34.0–46.6)
Hemoglobin: 13 g/dL (ref 11.1–15.9)
MCH: 28.3 pg (ref 26.6–33.0)
MCHC: 32.4 g/dL (ref 31.5–35.7)
MCV: 87 fL (ref 79–97)
PLATELETS: 195 10*3/uL (ref 150–379)
RBC: 4.59 x10E6/uL (ref 3.77–5.28)
RDW: 13.4 % (ref 12.3–15.4)
WBC: 4.1 10*3/uL (ref 3.4–10.8)

## 2016-10-18 ENCOUNTER — Telehealth: Payer: Self-pay

## 2016-10-18 NOTE — Telephone Encounter (Signed)
-----   Message from Marvel PlanJindong Xu, MD sent at 10/17/2016  1:41 PM EST ----- Could you please let the patient know that the blood test done yesterday in our office showed normal kidney function, no concerns at this time. Please continue current treatment. Thanks.  Marvel PlanJindong Xu, MD PhD Stroke Neurology 10/17/2016 1:41 PM

## 2016-10-18 NOTE — Telephone Encounter (Signed)
Rn receive incoming call  about lab work. Rn stated lab work was normal, and showed normal kidney function. Pt verbalized understanding.

## 2016-10-18 NOTE — Telephone Encounter (Signed)
LEft vm about lab work results.

## 2016-10-24 ENCOUNTER — Other Ambulatory Visit: Payer: Self-pay | Admitting: Neurology

## 2016-10-24 DIAGNOSIS — I610 Nontraumatic intracerebral hemorrhage in hemisphere, subcortical: Secondary | ICD-10-CM

## 2017-04-09 DIAGNOSIS — Z0271 Encounter for disability determination: Secondary | ICD-10-CM

## 2017-06-10 IMAGING — CT CT HEAD W/O CM
1 series · 15 of 29 positions shown, 19 images · non-contrast
Comparison: Yesterday

CLINICAL DATA: Intracranial hemorrhage follow-up.

EXAM:
CT HEAD WITHOUT CONTRAST
TECHNIQUE: Contiguous axial images were obtained from the base of the skull
through the vertex without intravenous contrast.

[Series 2: head 5.0 h30s · axial · 0.45mm/px · z∈[-137,-7]mm · 15 of 29 slices shown, 19 images]
[im 2/29  brain]
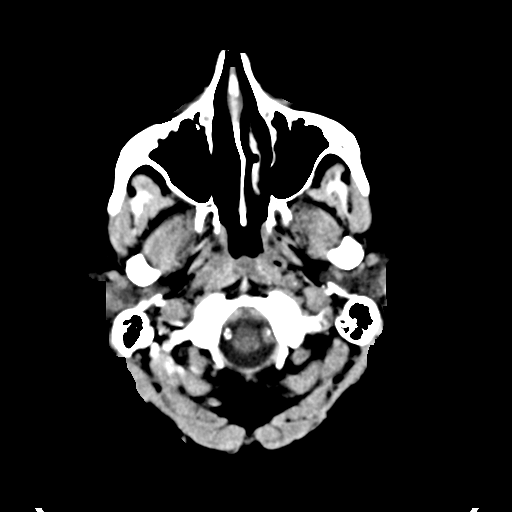
[im 2/29  bone]
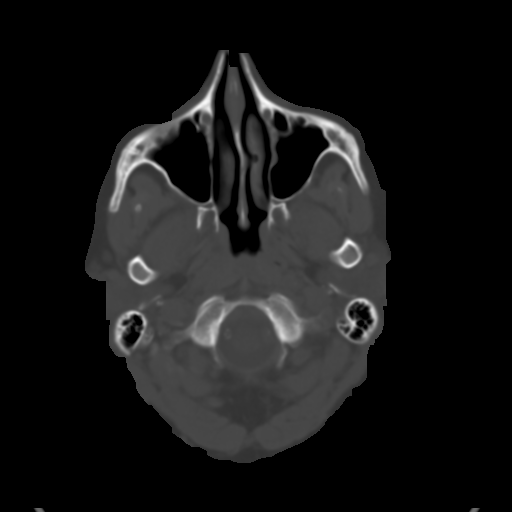
[im 4/29  brain]
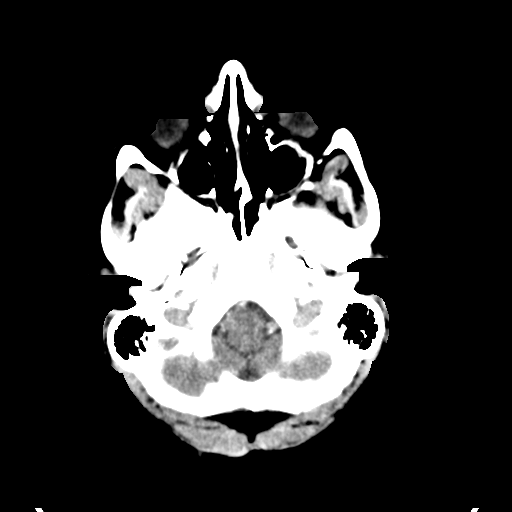
[im 6/29  brain]
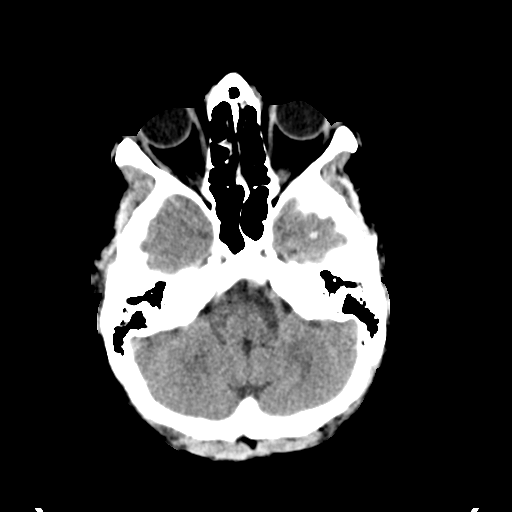
[im 8/29  brain]
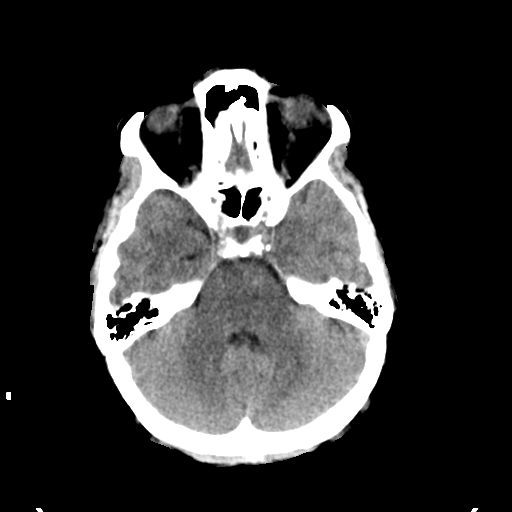
[im 10/29  brain]
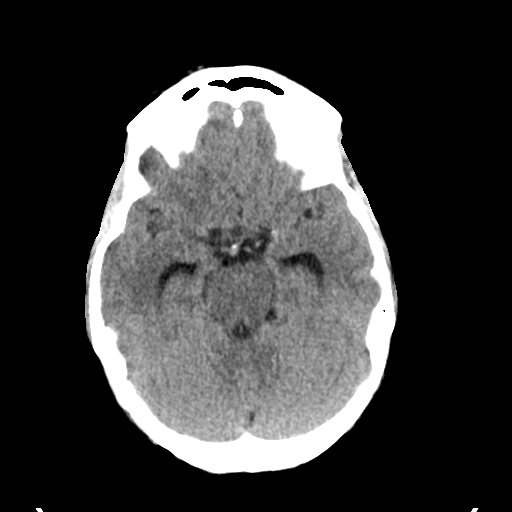
[im 10/29  bone]
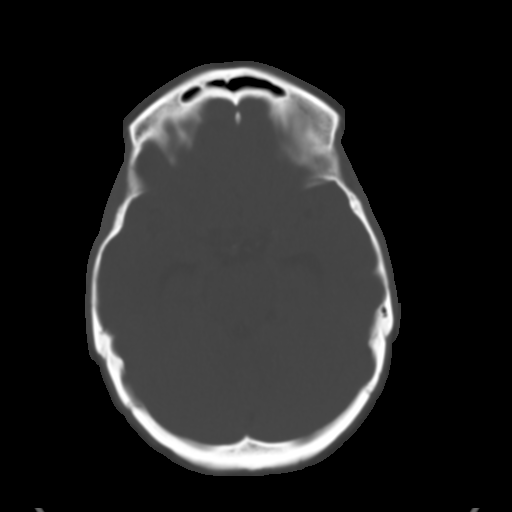
[im 11/29  brain]
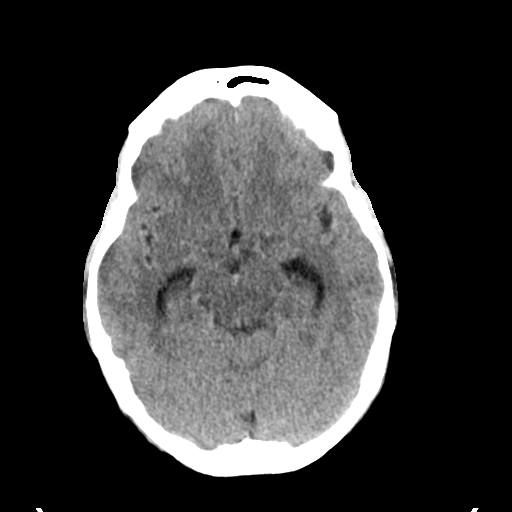
[im 13/29  brain]
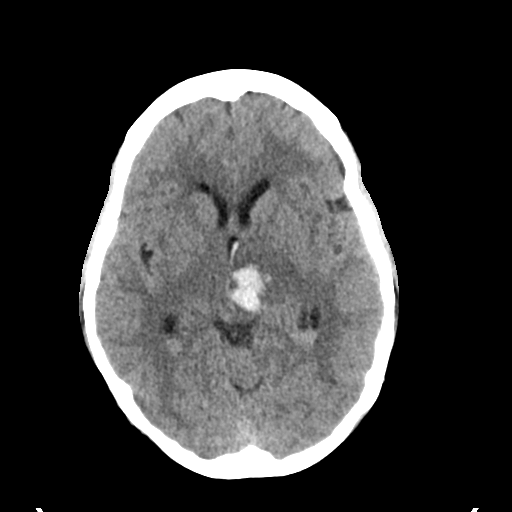
[im 15/29  brain]
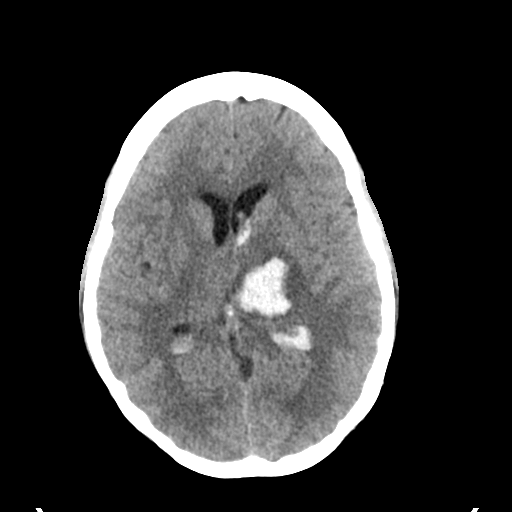
[im 17/29  brain]
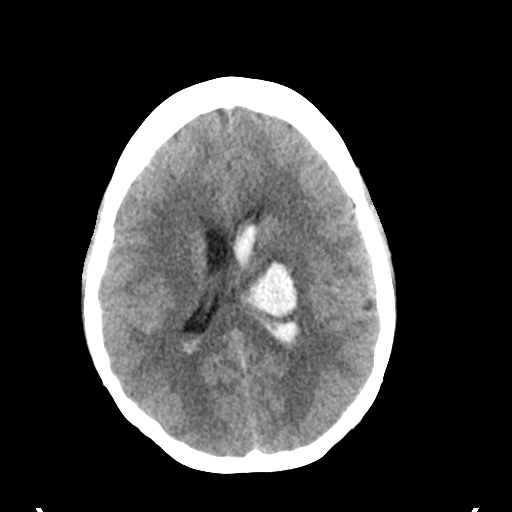
[im 17/29  bone]
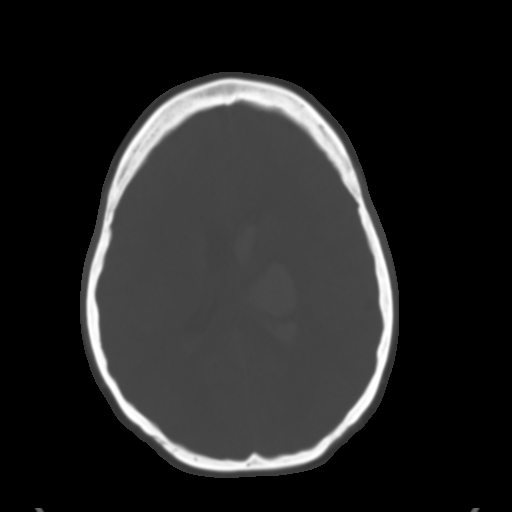
[im 19/29  brain]
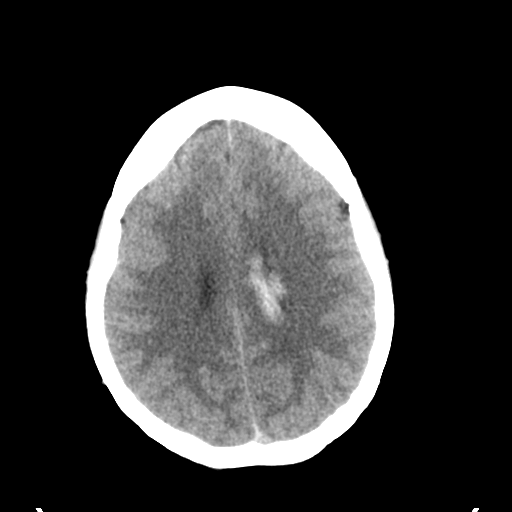
[im 20/29  brain]
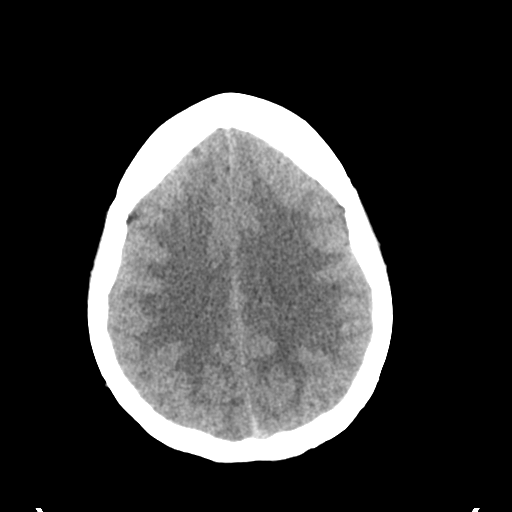
[im 22/29  brain]
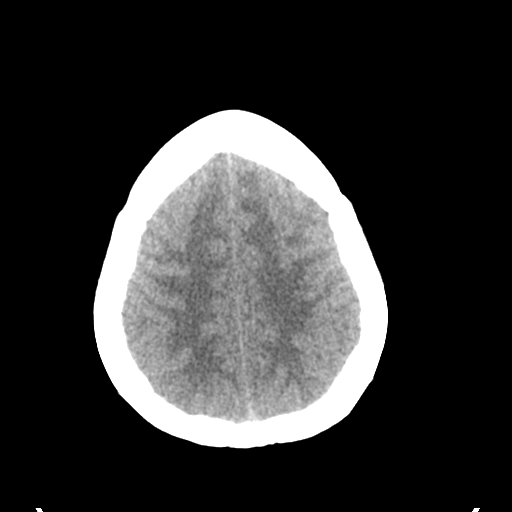
[im 24/29  brain]
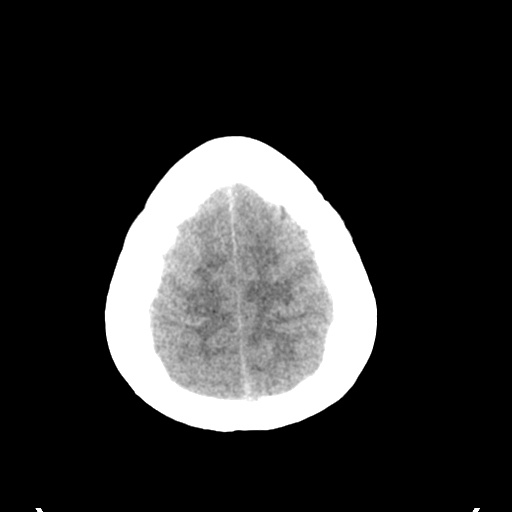
[im 24/29  bone]
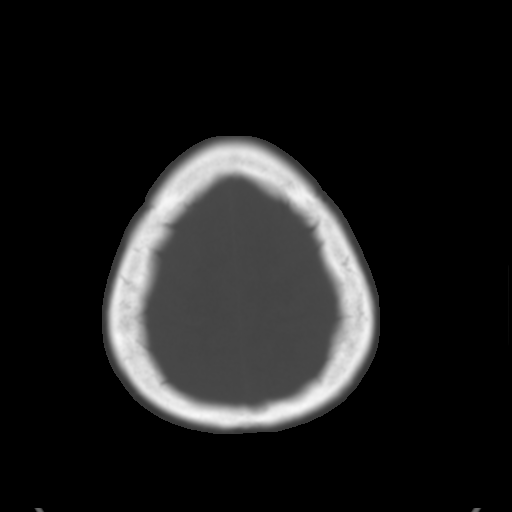
[im 26/29  brain]
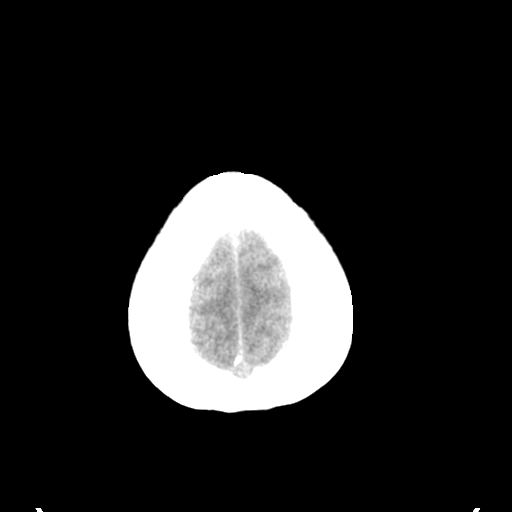
[im 28/29  brain]
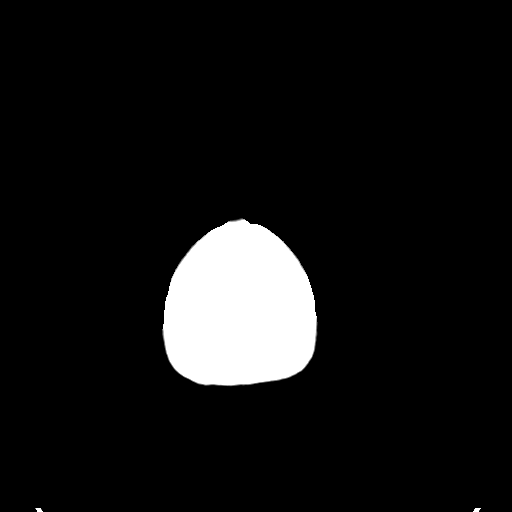

[15 of 29 positions shown; findings below may reference images not displayed]

FINDINGS: Skull and Sinuses:Negative for fracture or destructive process. The
mastoids, middle ears, and imaged paranasal sinuses are clear.

Orbits: No acute abnormality.

Brain: Left thalamic hematoma, dissecting into the neighboring white
matter tracts, size stable at 24 x 26 x estimated 30 mm in maximal
dimension (9 cc volume). Newly seen small volume hemorrhage layering
within the atrium of the right lateral ventricle, presumably re-
distributed given clot in the left lateral ventricle and
third/fourth ventricles has a stable appearance. Thin rim of edema
around the parenchymal hematoma is stable from previous. Mild
rightward midline shift is similar to prior. Newly seen hemorrhage
in the inner peduncular fossa, likely redistributed. Stable
dilatation of the temporal horns of the lateral ventricles. No
visible infarct. Premature calcified atherosclerosis.
IMPRESSION: 1. Unchanged left thalamic hematoma.
2. Newly seen right lateral ventricle and interpeduncular fossa
hemorrhage is likely redistribution. Overall, intraventricular
hematoma volume is similar to yesterday.
3. Stable mild dilation of the temporal horns.

## 2017-06-10 IMAGING — CR DG CHEST 1V PORT
1 series · 1 of 1 positions shown · non-contrast
Comparison: Same day.

CLINICAL DATA: Central line placement.

EXAM:
PORTABLE CHEST 1 VIEW

[AP]
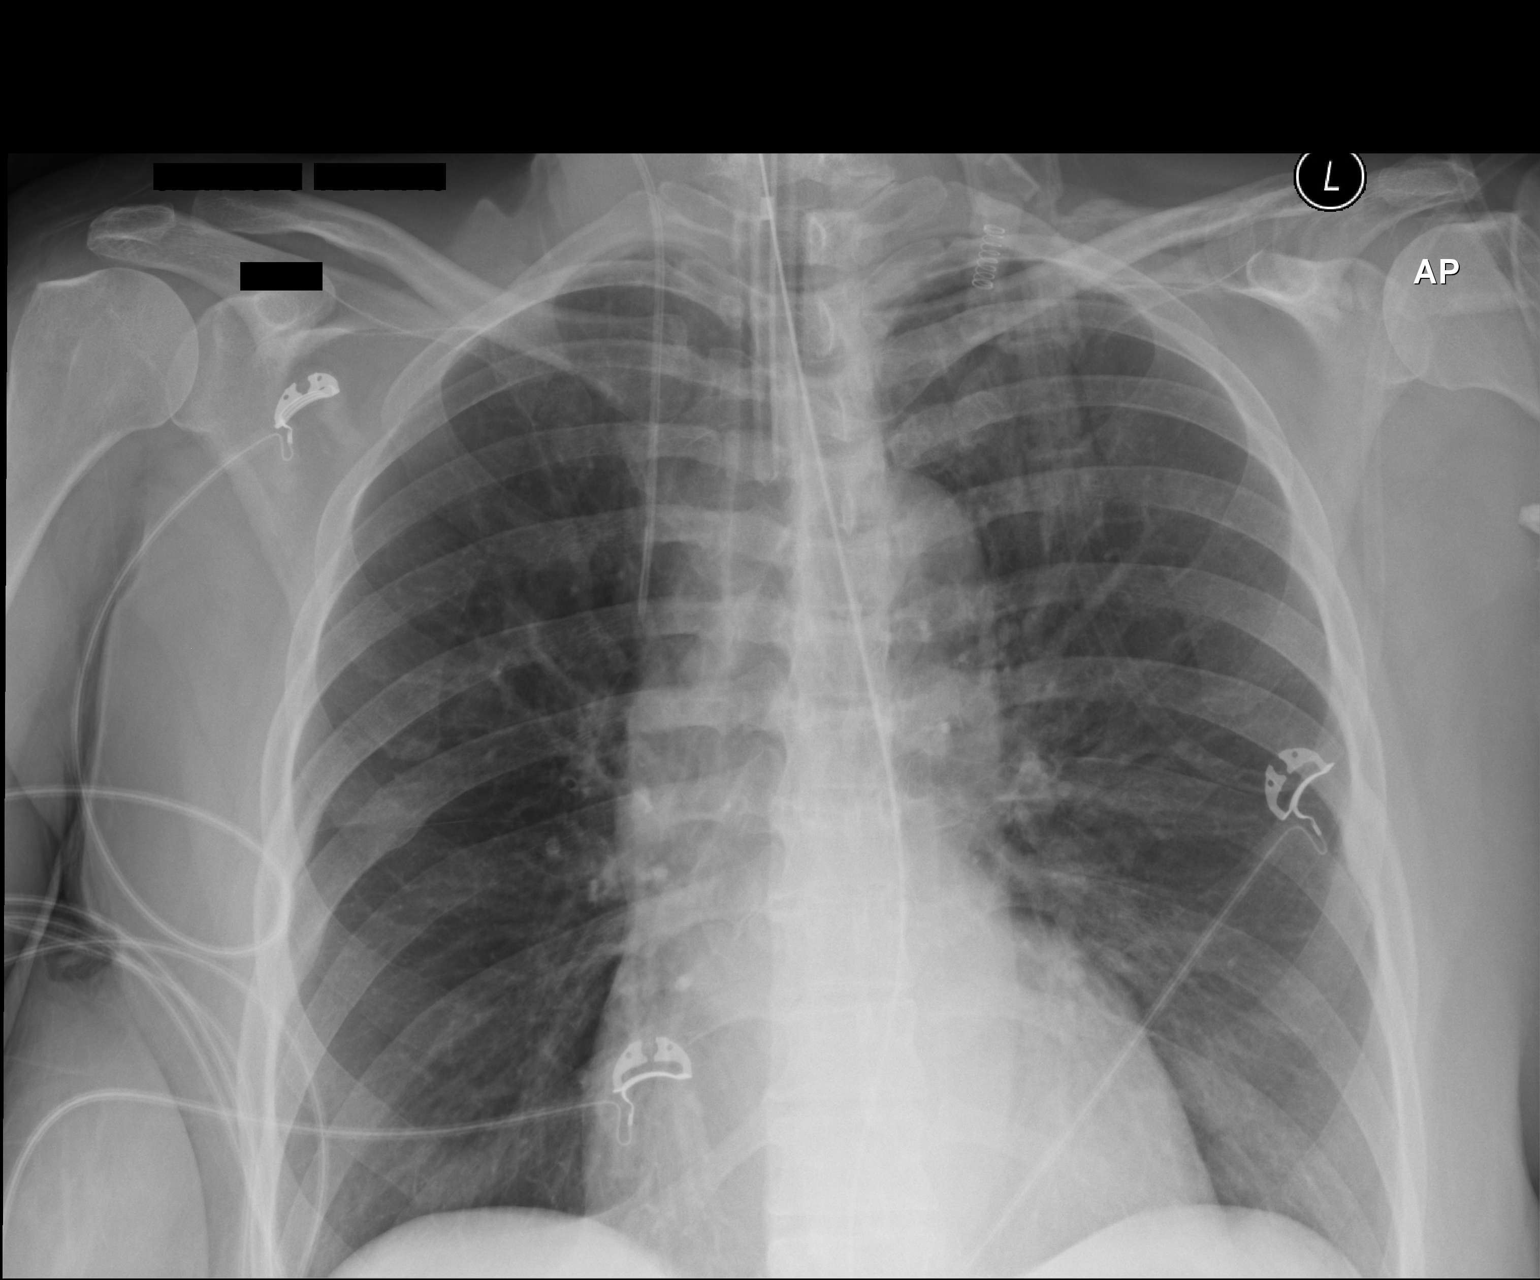

[1 of 1 positions shown; findings below may reference images not displayed]

FINDINGS: The heart size and mediastinal contours are within normal limits.
Both lungs are clear. Endotracheal and nasogastric tubes are
unchanged in position. No pneumothorax or pleural effusion is noted.
Interval placement of right internal jugular catheter with distal
tip overlying expected position of SVC. The visualized skeletal
structures are unremarkable.
IMPRESSION: Interval placement of right internal jugular catheter with distal
tip overlying expected position of the SVC. No pneumothorax is
noted. Stable support apparatus.

## 2017-06-11 IMAGING — CR DG CHEST 1V PORT
1 series · 1 of 1 positions shown · non-contrast
Comparison: 06/20/2015

CLINICAL DATA: Hypertension.  Ventilator

EXAM:
PORTABLE CHEST 1 VIEW

[AP]
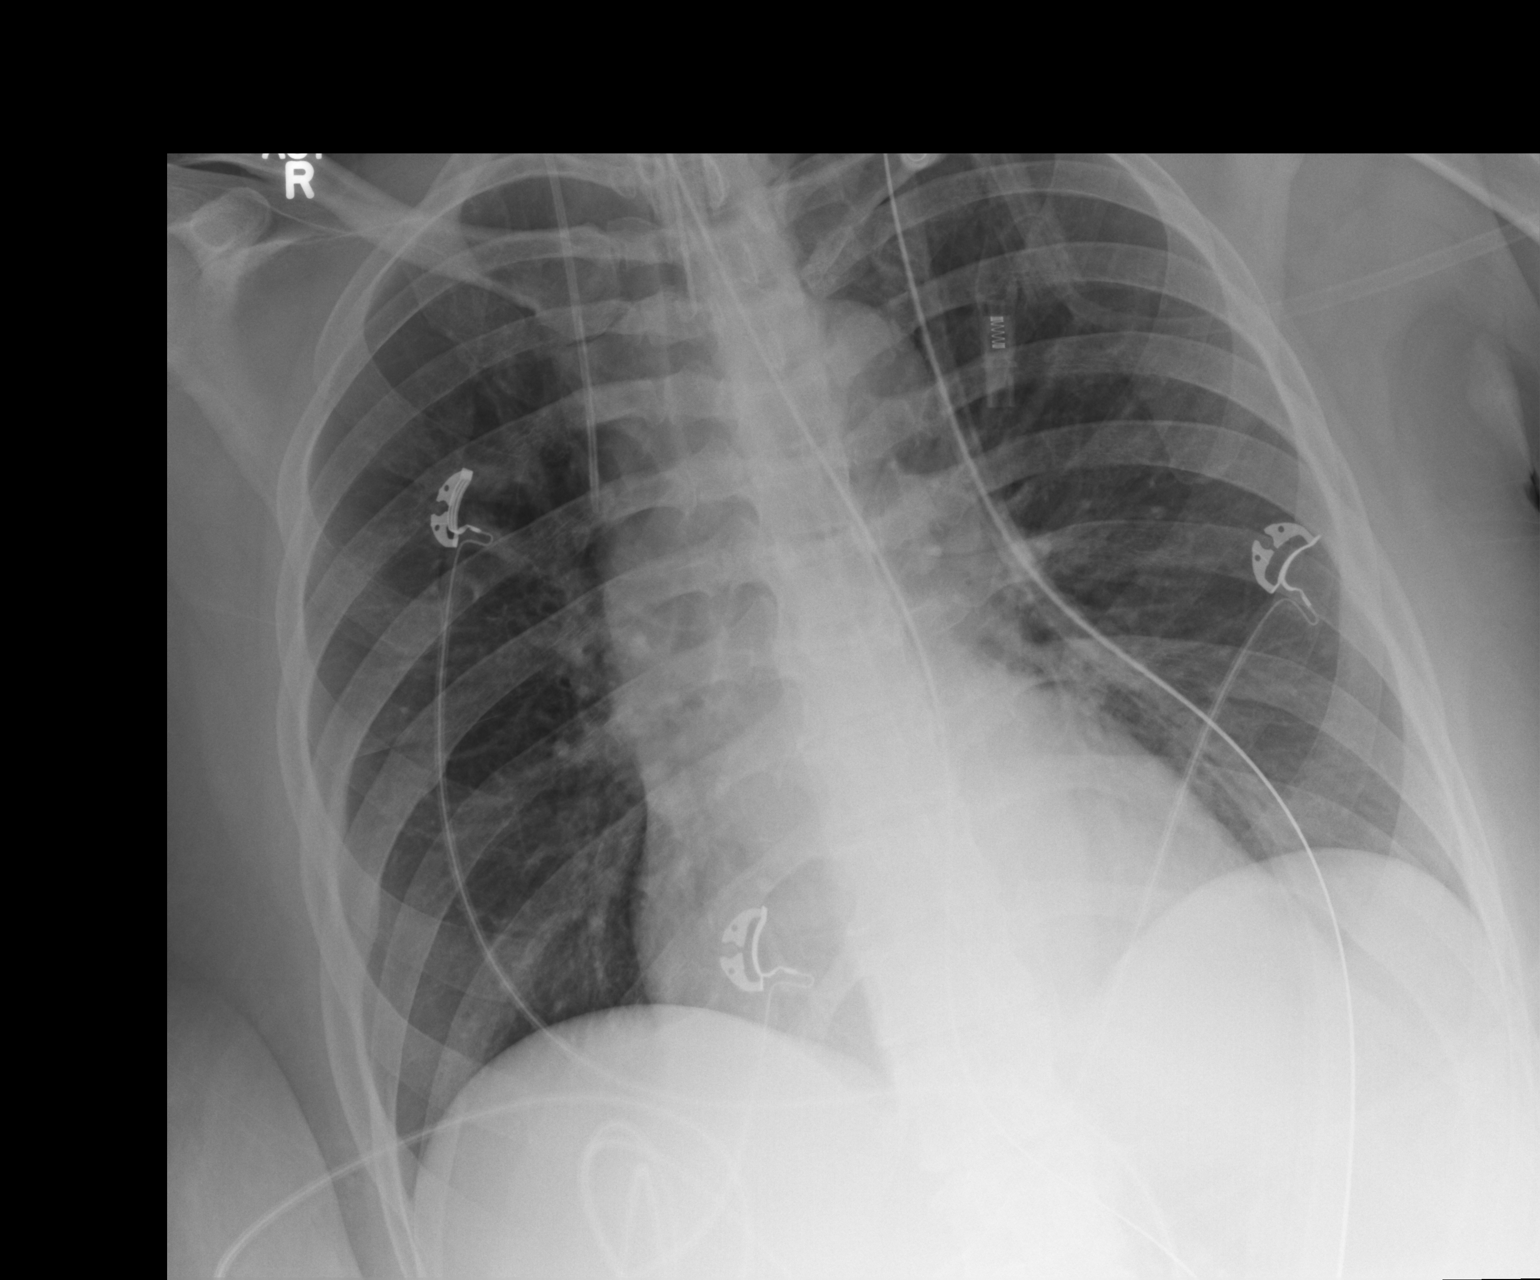

[1 of 1 positions shown; findings below may reference images not displayed]

FINDINGS: Support devices are stable. Heart is upper limits normal in size.
Lungs are clear. No effusions or acute bony abnormality.
IMPRESSION: Stable support devices.  No acute findings.

## 2017-07-23 DIAGNOSIS — Z0271 Encounter for disability determination: Secondary | ICD-10-CM

## 2018-01-22 ENCOUNTER — Other Ambulatory Visit: Payer: Self-pay | Admitting: Family Medicine

## 2018-01-22 DIAGNOSIS — Z1231 Encounter for screening mammogram for malignant neoplasm of breast: Secondary | ICD-10-CM

## 2022-01-24 ENCOUNTER — Ambulatory Visit: Payer: Medicare Other | Attending: Family Medicine | Admitting: Occupational Therapy

## 2022-01-24 DIAGNOSIS — R278 Other lack of coordination: Secondary | ICD-10-CM | POA: Insufficient documentation

## 2022-01-24 DIAGNOSIS — M6281 Muscle weakness (generalized): Secondary | ICD-10-CM | POA: Diagnosis present

## 2022-01-24 DIAGNOSIS — M25631 Stiffness of right wrist, not elsewhere classified: Secondary | ICD-10-CM | POA: Insufficient documentation

## 2022-01-24 DIAGNOSIS — M25641 Stiffness of right hand, not elsewhere classified: Secondary | ICD-10-CM | POA: Diagnosis present

## 2022-01-24 DIAGNOSIS — M25621 Stiffness of right elbow, not elsewhere classified: Secondary | ICD-10-CM | POA: Diagnosis present

## 2022-01-24 DIAGNOSIS — I69351 Hemiplegia and hemiparesis following cerebral infarction affecting right dominant side: Secondary | ICD-10-CM | POA: Diagnosis not present

## 2022-01-24 DIAGNOSIS — M25611 Stiffness of right shoulder, not elsewhere classified: Secondary | ICD-10-CM | POA: Insufficient documentation

## 2022-01-24 NOTE — Therapy (Signed)
?OUTPATIENT OCCUPATIONAL THERAPY NEURO EVALUATION ? ?Patient Name: Katie Nichols ?MRN: [id number] ?DOB:04-07-1971, 51 y.o., female ?Today's Date: 01/24/2022 ? ?PCP: Jolinda Croak, MD  ?REFERRING PROVIDER: Jolinda Croak, MD  ? ? OT End of Session - 01/24/22 1612   ? ? Visit Number 1   ? Number of Visits 17   ? Date for OT Re-Evaluation 03/22/22   ? Authorization Type Medicare A&B   ? Authorization - Visit Number 1   ? Progress Note Due on Visit 10   ? OT Start Time 1018   ? OT Stop Time 1105   ? OT Time Calculation (min) 47 min   ? Activity Tolerance Patient tolerated treatment well   ? Behavior During Therapy [facility] for tasks assessed/performed   ? ?  ?  ? ?  ? ? ?Past Medical History:  ?Diagnosis Date  ? Asthma   ? Hyperlipidemia   ? Hypertension   ? Stroke [facility])   ? intracerebral hemorrhage  ? ?No past surgical history on file. ?Patient Active Problem List  ? Diagnosis Date Noted  ? Nontraumatic subcortical hemorrhage of left cerebral hemisphere ([place]) 04/16/2016  ? HLD (hyperlipidemia) 04/16/2016  ? Right spastic hemiparesis ([place]) 04/16/2016  ? Left hemiparesis ([place]) 11/13/2015  ? Renal artery stenosis ([place]) 08/10/2015  ? Hypertension secondary to other renal disorders 08/10/2015  ? Right hemiplegia ([place]) 06/27/2015  ? HTN (hypertension), malignant   ? Acute respiratory failure with hypoxemia ([place]) 06/20/2015  ? ICH (intracerebral hemorrhage) ([place]) 06/19/2015  ? Encounter for intubation   ? Hypertensive emergency   ? Ventilator dependent ([place])   ? Hypocalcemia   ? ? ?ONSET DATE: 01/14/2022 (CVA Sept 2016) ? 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 ?THERAPY DIAG:  ?Hemiplegia and hemiparesis following cerebral infarction affecting right dominant side ([place]) ? ?Muscle weakness (generalized) ? ?Other lack of coordination ? ?Stiffness of right shoulder, not elsewhere classified ? ?Stiffness of right elbow, not elsewhere  classified ? ?Stiffness of right wrist, not elsewhere classified ? ?Stiffness of right hand, not elsewhere classified ? ?SUBJECTIVE:  ? ?SUBJECTIVE STATEMENT: ?Pt reports having a stroke in 2016, short stint in rehab facility.  Pt is now pursuing therapy services due to decreased ROM and tightness in dominant RUE. ?Pt accompanied by: self ? ?PERTINENT HISTORY: HTN, hyperlipidemia, moderate persistent asthma ? ?PRECAUTIONS: Fall ? ?WEIGHT BEARING RESTRICTIONS No ? ?PAIN:  ?Are you having pain? No ? ?FALLS: Has patient fallen in last 6 months? No ? ?LIVING ENVIRONMENT: ?Lives with: lives with their family and lives with their son ?Lives in: Other condo ?Stairs:  threshold to enter, single level  ?Has following equipment at home: Quad cane large base, Walker - 4 wheeled, shower chair, Grab bars, and transport chair ? ?PLOF: Independent ? ?PATIENT GOALS To have more movement and function in RUE. ? ?OBJECTIVE:  ? ?HAND DOMINANCE: Right ? ?ADLs: ?Overall ADLs: Pt reports that she is able to complete all ADLs with increased time and no physical assistance. ?Transfers/ambulation related to ADLs: Mod I with large base quad cane ?Eating: uses Left hand for eating, does not cut anything usually eats more finger food or spoon for cereal ?Grooming: Performing L handed, no assist ?UB Dressing: Mod I - increased time, hooking bra in front of self and then turning it around torso ?LB Dressing: Mod I ?Toileting: Mod I ?Bathing: Mod I ?Tub Shower transfers: Mod I into walk-in shower, has grab  bars and shower chair ?Equipment: Shower seat without back, Grab bars, and Walk in shower ? ? ?IADLs: ?Shopping: Pt orders groceries and picks them up and will sometimes use the motorized cart in the grocery store, son will bring groceries in ?Light housekeeping: completes own laundry, loads/empties dishwasher, sweeps ?Meal Prep: uses air fryer mostly ?Community mobility: driving, uses motorized cart when available in stores ?Medication  management: Independent with use of pill box ?Financial management: Independent ?Handwriting:  writing with non-dominant due to hemiparesis ? ?MOBILITY STATUS:  Mod I with large base quad cane with increased effort ? ?POSTURE COMMENTS:  ?rounded shoulders ? ? ?ACTIVITY TOLERANCE: ?Activity tolerance: prolonged sitting and standing are tiring and can be very painful when transitioning out of position ? ? ?UE ROM    ? ?Active ROM Right ?01/24/2022 Left ?01/24/2022  ?Shoulder flexion 80   ?Shoulder abduction 75   ?Shoulder adduction    ?Shoulder extension    ?Shoulder internal rotation    ?Shoulder external rotation    ?Elbow flexion 100   ?Elbow extension -20   ?Wrist flexion    ?Wrist extension -30   ?Wrist ulnar deviation    ?Wrist radial deviation    ?Wrist pronation    ?Wrist supination    ?(Blank rows = not tested) ? ?Passive ROM Right ?01/24/2022 Left ?01/24/2022  ?Shoulder flexion 95   ?Shoulder abduction 85   ?Shoulder adduction    ?Shoulder extension    ?Shoulder internal rotation    ?Shoulder external rotation    ?Elbow flexion    ?Elbow extension -5   ?Wrist flexion 30   ?Wrist extension 40   ?Wrist ulnar deviation    ?Wrist radial deviation    ?Wrist pronation    ?Wrist supination    ?(Blank rows = not tested) ? ? ?UE MMT:    ? ?MMT Right ?01/24/2022 Left ?01/24/2022  ?Shoulder flexion 2-/5   ?Shoulder abduction    ?Shoulder adduction    ?Shoulder extension    ?Shoulder internal rotation    ?Shoulder external rotation    ?Middle trapezius    ?Lower trapezius    ?Elbow flexion 2-/5   ?Elbow extension    ?Wrist flexion    ?Wrist extension    ?Wrist ulnar deviation    ?Wrist radial deviation    ?Wrist pronation    ?Wrist supination    ?(Blank rows = not tested) ? ?HAND FUNCTION: ?Unable to assess with dynamometer due to decreased hand opening/closing.  Pt with loose gross grasp on therapist's fingers and then unable to open fingers back up to release grasp. ? ?COORDINATION: ?Finger Nose Finger test: unable to formally  assess as pt requires increased time and effort to even nearly touch nose.   ?Box and Blocks:  Right 5 blocks - uses thumb and side of index finger to grasp blocks, hikes shoulder to compensate for decreased elbow and wrist movements, Left 40 blocks ? ?SENSATION: ?Not tested at eval, needs to be formally assessed ? ?MUSCLE TONE: RUE: Severe, Hypertonic, and Modifed Ashworth Scale 3 = Considerable increase in muscle tone, passive movement difficult ? ?COGNITION: ?Overall cognitive status: Within functional limits for tasks assessed ? ?VISION: ?Subjective report: reports "a little bit" of double vision ?Baseline vision: Wears glasses for reading only ? ? ?VISION ASSESSMENT: ?To be further assessed in functional context ? ?Patient has difficulty with following activities due to following visual impairments: reports difficulty with walking, especially when out in public, due to "a little bit" of double  vision ? ?OBSERVATIONS: Keeps arm in flexion and internally rotated ? ? ? ?PATIENT EDUCATION: ?Education details: Educated on role and purpose of OT as well as potential interventions and goals for therapy based on initial evaluation findings. ?Person educated: Patient ?Education method: Explanation ?Education comprehension: verbalized understanding ? ? ?HOME EXERCISE PROGRAM: ?TBD ? ? ? ?GOALS: ?Goals reviewed with patient? No ? ?SHORT TERM GOALS: Target date:  02/22/22 ? ?Pt will tolerate HEP/stretching program for RUE ?Baseline: ?Goal status: INITIAL ? ?2.  Pt will demonstrate improved active elbow extension to -10* to increase functional use. ?Baseline: AROM -20 PROM -5 ?Goal status: INITIAL ? ?3.  Pt will demonstrate improved shoulder flexion to 90* to increase functional use. ?Baseline: 80* ?Goal status: INITIAL ? ? ?LONG TERM GOALS: Target date:  03/22/22 ? ?Pt will tolerate advanced HEP/stretching program for RUE ?Baseline:  ?Goal status: INITIAL ? ?2.  Pt will demonstrate improved active elbow extension to neutral  to increase functional use. ?Baseline: -20* ?Goal status: INITIAL ? ?3.  Pt will demonstrate improved shoulder flexion to 100* to increase functional use. ?Baseline: 80* ?Goal status: INITIAL ? ?4.  Pt will demonstrate

## 2022-01-30 ENCOUNTER — Ambulatory Visit: Payer: Medicare Other | Admitting: Occupational Therapy

## 2022-01-30 DIAGNOSIS — M6281 Muscle weakness (generalized): Secondary | ICD-10-CM

## 2022-01-30 DIAGNOSIS — R278 Other lack of coordination: Secondary | ICD-10-CM

## 2022-01-30 DIAGNOSIS — I69351 Hemiplegia and hemiparesis following cerebral infarction affecting right dominant side: Secondary | ICD-10-CM | POA: Diagnosis not present

## 2022-01-30 NOTE — Therapy (Signed)
?OUTPATIENT OCCUPATIONAL THERAPY TREATMENT NOTE ? ? ?Patient Name: Katie Nichols ?MRN: [id number] ?DOB:August 13, 1971, 51 y.o., female ?Today's Date: 01/30/2022 ? ?PCP: Stevphen Rochester, MD   ?REFERRING PROVIDER: Stevphen Rochester, MD  ? ?END OF SESSION: ? ? OT End of Session - 01/30/22 1439   ? ? Visit Number 2   ? Number of Visits 17   ? Date for OT Re-Evaluation 03/22/22   ? Authorization Type Medicare A&B   ? Authorization - Visit Number 2   ? Progress Note Due on Visit 10   ? OT Start Time 1104   ? OT Stop Time 1148   ? OT Time Calculation (min) 44 min   ? Activity Tolerance Patient tolerated treatment well   ? Behavior During Therapy [facility] for tasks assessed/performed   ? ?  ?  ? ?  ? ?Past Medical History:  ?Diagnosis Date  ? Asthma   ? Hyperlipidemia   ? Hypertension   ? Stroke [facility])   ? intracerebral hemorrhage  ? ?No past surgical history on file. ?Patient Active Problem List  ? Diagnosis Date Noted  ? 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 HLD (hyperlipidemia) 04/16/2016  ? Right spastic hemiparesis (HCC) 04/16/2016  ? Left hemiparesis (HCC) 11/13/2015  ? Renal artery stenosis (HCC) 08/10/2015  ? Hypertension secondary to other renal disorders 08/10/2015  ? Right hemiplegia (HCC) 06/27/2015  ? HTN (hypertension), malignant   ? Acute respiratory failure with hypoxemia (HCC) 06/20/2015  ? ICH (intracerebral hemorrhage) (HCC) 06/19/2015  ? Encounter for intubation   ? Hypertensive emergency   ? Ventilator dependent (HCC)   ? Hypocalcemia   ? ? ?ONSET DATE: 01/14/2022 (CVA Sept 2016) ?  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 ?THERAPY DIAG:  ?Hemiplegia and hemiparesis following cerebral infarction affecting right dominant side (HCC) ? ?Muscle weakness (generalized) ? ?Other lack of coordination ? ? ?PERTINENT HISTORY: L thalamic/BG hemorrhage w/ R hemi in 2016; HTN, HLD, moderate  persistent asthma ? ?PRECAUTIONS: Fall ? ?SUBJECTIVE:  ? ?SUBJECTIVE STATEMENT: ?Pt reports she has not followed up w/ her neurologist since 2018 and is not currently on any medications to manage spasticity. ? ?PAIN: ?Are you having pain? No ? ? ?OBJECTIVE:  ?  ?UE ROM - per evaluation on 01/24/22    ?  ? Right ?AROM Right ?Passive  ?Shoulder flexion 80 95  ?Shoulder abduction 75 85  ?Elbow flexion 100   ?Elbow extension -20 -5  ?Wrist flexion   30  ?Wrist extension -30 40  ? (Blank rows = not tested) ? ? ?TODAY'S TREATMENT: ?Closed-chain AAROM of BUE shoulder flexion to chest height w/ hands clasped completed in sitting; pt required verbal cues and relevant education for positioning and alignment ?Attempted UE Ranger for functional forward reach w/ pt able to achieve great elbow extension, but difficulty maintaining hand position on ranger and activity was discontinued ?OT performed PROM of composite wrist and hand extension incorporating passive stretch held at end range to increase ROM and decrease joint stiffness for potential increase in motor function. Able to achieve fair composite extension w/ pt able to tolerate exercise w/out discomfort; PROM limited by contracture ?OT then facilitated self-PROM of wrist extension, discussing anatomy and eventual self-stretching of composite extension as able. Attempted various methods w/ most success achieving stretch in prayer position w/ hands clasped; completed x5 slowly w/out difficulty or discomfort ?Orthosis management - pt  arrived w/ self-purchased, hand-based anti-spasticity orthosis; OT assisted pt w/ donning orthosis and was unable to position palm flush against the splint due to spasticity. Relevant education provided regarding both spasticity (see below) and potential benefit of ball orthosis or positioning a towel roll in the palm to assist w/ management ? ? ?PATIENT EDUCATION: ?Education details: Provided condition-specific education, including neuro reed and  typical recovery patterns, as well as regarding spasticity and options for management of spasticity (e.g., stretching, ROM, oral medication, botox injection) w/ handout from American [employer]roke Association website provided ?Person educated: Patient ?Education method: Explanation, Demonstration, Verbal cues, and Handouts ?Education comprehension: verbalized understanding and returned demonstration  ? ? ?HOME EXERCISE PROGRAM: ?MedBridge Code: [id number] (AAROM of shoulder flexion and wrist prayer stretch w/ hands clasped) ? ? ?GOALS: ?Goals reviewed with patient? Yes ?  ?SHORT TERM GOALS: Target date:  02/22/22 ?  ?Pt will tolerate HEP/stretching program for RUE ?Baseline: ?Goal status: On-going ?  ?2.  Pt will demonstrate improved active elbow extension to -10* to increase functional use. ?Baseline: AROM -20 PROM -5 ?Goal status: On-going ?  ?3.  Pt will demonstrate improved shoulder flexion to 90* to increase functional use. ?Baseline: 80* ?Goal status: On-going ?  ?  ?LONG TERM GOALS: Target date:  03/22/22 ?  ?Pt will tolerate advanced HEP/stretching program for RUE ?Baseline:  ?Goal status: On-going ?  ?2.  Pt will demonstrate improved active elbow extension to neutral to increase functional use. ?Baseline: -20* ?Goal status: On-going ?  ?3.  Pt will demonstrate improved shoulder flexion to 100* to increase functional use. ?Baseline: 80* ?Goal status: On-going ?  ?4.  Pt will demonstrate improved UE functional use/functional grasp for ADLs as evidenced by increasing box/ blocks score by 3 blocks with RUE ?Baseline: 5 ?Goal status: On-going ?  ?5.  Pt will be independent with splint wear and care PRN. ?Baseline: TBD ?Goal status: On-going ?  ?  ?ASSESSMENT: ?  ?CLINICAL IMPRESSION: ?Pt returns for first follow-up treatment session after initial evaluation on 01/24/22; OT reviewed all goals w/ pt who is agreeable to plan at this time. Session today focused on initiating HEP for self-stretching and condition-specific  education, particularly regarding hypertonicity and spasticity. Flexor synergy pattern is most limiting to functional use of RUE, particularly grasp and release and functional reach. OT discussed potential benefit of botox injections or muscle relaxant this session and encouraged pt to discuss this w/ her MD; pt verbalizing understanding. ? ?PERFORMANCE DEFICITS in functional skills including ADLs, IADLs, coordination, dexterity, sensation, tone, ROM, strength, pain, flexibility, GMC, and UE functional use.  ? ?IMPAIRMENTS are limiting patient from ADLs and IADLs.  ? ?COMORBIDITIES may have co-morbidities  that affects occupational performance. Patient will benefit from skilled OT to address above impairments and improve overall function. ? ?  ?PLAN: ?OT FREQUENCY: 2x/week ? ?OT DURATION: 8 weeks ? ?PLANNED INTERVENTIONS: self care/ADL training, therapeutic exercise, therapeutic activity, neuromuscular re-education, manual therapy, passive range of motion, balance training, functional mobility training, splinting, electrical stimulation, ultrasound, moist heat, cryotherapy, patient/family education, and DME and/or AE instructions ? ?RECOMMENDED OTHER SERVICES: NA ? ?CONSULTED AND AGREED WITH PLAN OF CARE: Patient ? ?PLAN FOR NEXT SESSION: Initiate stretching/ROM HEP ? ? ?Rosie Fate, MSOT, OTR/L ?01/30/2022, 2:40 PM ?

## 2022-02-01 ENCOUNTER — Ambulatory Visit: Payer: Medicare Other | Admitting: Occupational Therapy

## 2022-02-01 DIAGNOSIS — R278 Other lack of coordination: Secondary | ICD-10-CM

## 2022-02-01 DIAGNOSIS — M6281 Muscle weakness (generalized): Secondary | ICD-10-CM

## 2022-02-01 DIAGNOSIS — I69351 Hemiplegia and hemiparesis following cerebral infarction affecting right dominant side: Secondary | ICD-10-CM

## 2022-02-01 DIAGNOSIS — M25611 Stiffness of right shoulder, not elsewhere classified: Secondary | ICD-10-CM

## 2022-02-01 DIAGNOSIS — M25641 Stiffness of right hand, not elsewhere classified: Secondary | ICD-10-CM

## 2022-02-01 DIAGNOSIS — M25631 Stiffness of right wrist, not elsewhere classified: Secondary | ICD-10-CM

## 2022-02-01 DIAGNOSIS — M25621 Stiffness of right elbow, not elsewhere classified: Secondary | ICD-10-CM

## 2022-02-01 NOTE — Therapy (Signed)
?OUTPATIENT OCCUPATIONAL THERAPY TREATMENT NOTE ? ? ?Patient Name: Katie Nichols ?MRN: [id number] ?DOB:05-27-1971, 51 y.o., female ?Today's Date: 02/01/2022 ? ?PCP: Stevphen Rochester, MD   ?REFERRING PROVIDER: Stevphen Rochester, MD  ? ?END OF SESSION: ? ? OT End of Session - 02/01/22 1103   ? ? Visit Number 3   ? Number of Visits 17   ? Date for OT Re-Evaluation 03/22/22   ? Authorization Type Medicare A&B   ? Authorization - Visit Number 3   ? Progress Note Due on Visit 10   ? OT Start Time 1102   ? OT Stop Time 1145   ? OT Time Calculation (min) 43 min   ? Activity Tolerance Patient tolerated treatment well   ? Behavior During Therapy [facility] for tasks assessed/performed   ? ?  ?  ? ?  ? ? ?Past Medical History:  ?Diagnosis Date  ? Asthma   ? Hyperlipidemia   ? Hypertension   ? Stroke [facility])   ? intracerebral hemorrhage  ? ?No past surgical history on file. ?Patient Active Problem List  ? Diagnosis Date Noted  ? 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 HLD (hyperlipidemia) 04/16/2016  ? Right spastic hemiparesis (HCC) 04/16/2016  ? Left hemiparesis (HCC) 11/13/2015  ? Renal artery stenosis (HCC) 08/10/2015  ? Hypertension secondary to other renal disorders 08/10/2015  ? Right hemiplegia (HCC) 06/27/2015  ? HTN (hypertension), malignant   ? Acute respiratory failure with hypoxemia (HCC) 06/20/2015  ? ICH (intracerebral hemorrhage) (HCC) 06/19/2015  ? Encounter for intubation   ? Hypertensive emergency   ? Ventilator dependent (HCC)   ? Hypocalcemia   ? ? ?ONSET DATE: 01/14/2022 (CVA Sept 2016) ?  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 ?THERAPY DIAG:  ?Hemiplegia and hemiparesis following cerebral infarction affecting right dominant side (HCC) ? ?Muscle weakness (generalized) ? ?Other lack of coordination ? ?Stiffness of right shoulder, not elsewhere classified ? ?Stiffness of right  elbow, not elsewhere classified ? ?Stiffness of right wrist, not elsewhere classified ? ?Stiffness of right hand, not elsewhere classified ? ? ?PERTINENT HISTORY: L thalamic/BG hemorrhage w/ R hemiparesis in 2016; HTN, HLD, moderate persistent asthma ? ?PRECAUTIONS: Fall ? ?SUBJECTIVE:  ? ?SUBJECTIVE STATEMENT: ?Pt reports completing ? ?PAIN: ?Are you having pain? No ? ? ?OBJECTIVE:  ?  ?UE ROM - per evaluation on 01/24/22    ?  ? Right ?AROM Right ?Passive  ?Shoulder flexion 80 95  ?Shoulder abduction 75 85  ?Elbow flexion 100   ?Elbow extension -20 -5  ?Wrist flexion   30  ?Wrist extension -30 40  ? (Blank rows = not tested) ? ? ?TODAY'S TREATMENT: ?Educated on splint alternatives and options for spasticity in hand and wrist.  Trialed round ball and more cylindrical shapes.  Settled on rolled up wash cloth in hand for increased functional positioning of fingers in increased extension and to allow for improved skin integrity. ?Reviewed prayer stretch and AAROM shoulder flexion with pt demonstrating good technique. ?Therapist instructed pt in dowel exercises with cane addressing shoulder flexion, chest press, internal/external rotation with "row the boat" bilaterally, and abduction/adduction with "rock the baby".  Pt completed 1 set of 10 each with good effort and min cues for technique. ? ? ?01/30/22 ?Closed-chain AAROM of BUE shoulder flexion to chest height w/ hands clasped completed in sitting ?PROM of composite wrist and hand extension incorporating passive stretch  held at end range to increase ROM and decrease joint stiffness for potential increase in motor function. ?self-PROM of wrist extension ?Orthosis management  ? ?PATIENT EDUCATION: ?Education details: Provided condition-specific education, including neuro reed and typical recovery patterns, as well as regarding spasticity and options for management of spasticity (e.g., stretching, ROM, oral medication, botox injection) w/ handout from American Stroke  Association website provided ?Person educated: Patient ?Education method: Explanation, Demonstration, Verbal cues, and Handouts ?Education comprehension: verbalized understanding and returned demonstration  ? ? ?HOME EXERCISE PROGRAM: ?MedBridge Code: [id number] (AAROM of shoulder flexion and wrist prayer stretch w/ hands clasped) ?  ?   ?GOALS: ?Goals reviewed with patient? Yes ?  ?SHORT TERM GOALS: Target date:  02/22/22 ?  ?Pt will tolerate HEP/stretching program for RUE ?Baseline: ?Goal status: On-going ?  ?2.  Pt will demonstrate improved active elbow extension to -10* to increase functional use. ?Baseline: AROM -20 PROM -5 ?Goal status: On-going ?  ?3.  Pt will demonstrate improved shoulder flexion to 90* to increase functional use. ?Baseline: 80* ?Goal status: On-going ?  ?  ?LONG TERM GOALS: Target date:  03/22/22 ?  ?Pt will tolerate advanced HEP/stretching program for RUE ?Baseline:  ?Goal status: On-going ?  ?2.  Pt will demonstrate improved active elbow extension to neutral to increase functional use. ?Baseline: -20* ?Goal status: On-going ?  ?3.  Pt will demonstrate improved shoulder flexion to 100* to increase functional use. ?Baseline: 80* ?Goal status: On-going ?  ?4.  Pt will demonstrate improved UE functional use/functional grasp for ADLs as evidenced by increasing box/ blocks score by 3 blocks with RUE ?Baseline: 5 ?Goal status: On-going ?  ?5.  Pt will be independent with splint wear and care PRN. ?Baseline: TBD ?Goal status: On-going ?  ?  ?ASSESSMENT: ?  ?CLINICAL IMPRESSION: ?Session today focused on reviewing and adding to HEP for self-stretching.  Pt with good technique and awareness of importance of quality of movement and stretch.  Therapist providing condition-specific education, particularly regarding hypertonicity and spasticity. Flexor synergy pattern is most limiting to functional use of RUE, particularly grasp and release and functional reach. ?  ?PERFORMANCE DEFICITS in functional skills  including ADLs, IADLs, coordination, dexterity, sensation, tone, ROM, strength, pain, flexibility, GMC, and UE functional use.  ?  ?IMPAIRMENTS are limiting patient from ADLs and IADLs.  ?  ?COMORBIDITIES may have co-morbidities  that affects occupational performance. Patient will benefit from skilled OT to address above impairments and improve overall function.  ?  ?  ?PLAN: ?OT FREQUENCY: 2x/week ?  ?OT DURATION: 8 weeks ?  ?PLANNED INTERVENTIONS: self care/ADL training, therapeutic exercise, therapeutic activity, neuromuscular re-education, manual therapy, passive range of motion, balance training, functional mobility training, splinting, electrical stimulation, ultrasound, moist heat, cryotherapy, patient/family education, and DME and/or AE instructions ?  ?RECOMMENDED OTHER SERVICES: NA ?  ?CONSULTED AND AGREED WITH PLAN OF CARE: Patient ?  ?PLAN FOR NEXT SESSION: Review and add to stretching/ROM HEP ? ? ?Rosalio Loud, MS, OTR/L ?02/01/2022, 11:03 AM ?

## 2022-02-06 ENCOUNTER — Encounter: Payer: Self-pay | Admitting: Occupational Therapy

## 2022-02-06 ENCOUNTER — Ambulatory Visit: Payer: Medicare Other | Admitting: Occupational Therapy

## 2022-02-06 DIAGNOSIS — I69351 Hemiplegia and hemiparesis following cerebral infarction affecting right dominant side: Secondary | ICD-10-CM | POA: Diagnosis not present

## 2022-02-06 DIAGNOSIS — M25611 Stiffness of right shoulder, not elsewhere classified: Secondary | ICD-10-CM

## 2022-02-06 DIAGNOSIS — M25641 Stiffness of right hand, not elsewhere classified: Secondary | ICD-10-CM

## 2022-02-06 DIAGNOSIS — R278 Other lack of coordination: Secondary | ICD-10-CM

## 2022-02-06 DIAGNOSIS — M6281 Muscle weakness (generalized): Secondary | ICD-10-CM

## 2022-02-06 DIAGNOSIS — M25631 Stiffness of right wrist, not elsewhere classified: Secondary | ICD-10-CM

## 2022-02-06 DIAGNOSIS — M25621 Stiffness of right elbow, not elsewhere classified: Secondary | ICD-10-CM

## 2022-02-06 NOTE — Therapy (Signed)
OUTPATIENT OCCUPATIONAL THERAPY TREATMENT NOTE   Patient Name: Katie Nichols MRN: [id number] DOB:[birth year], 51 y.o., female Today's Date: 02/06/2022  PCP: Stevphen RochesterManfredi, Brenda L, MD   REFERRING PROVIDER: Stevphen RochesterManfredi, Brenda L, MD   END OF SESSION:   OT End of Session - 02/06/22 1020     Visit Number 4    Number of Visits 17    Date for OT Re-Evaluation 03/22/22    Authorization Type Medicare A&B    Authorization - Visit Number 4    Progress Note Due on Visit 10    OT Start Time 1016    OT Stop Time 1100    OT Time Calculation (min) 44 min    Activity Tolerance Patient tolerated treatment well    Behavior During Therapy [facility] for tasks assessed/performed            Past Medical History:  Diagnosis Date   Asthma    Hyperlipidemia    Hypertension    Stroke [facility](HCC)    intracerebral hemorrhage   History reviewed. No pertinent surgical history. 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) 04/16/2016   HLD (hyperlipidemia) 04/16/2016   Right spastic hemiparesis (HCC) 04/16/2016   Left hemiparesis (HCC) 11/13/2015   Renal artery stenosis (HCC) 08/10/2015   Hypertension secondary to other renal disorders 08/10/2015   Right hemiplegia (HCC) 06/27/2015   HTN (hypertension), malignant    Acute respiratory failure with hypoxemia (HCC) 06/20/2015   ICH (intracerebral hemorrhage) (HCC) 06/19/2015   Encounter for intubation    Hypertensive emergency    Ventilator dependent (HCC)    Hypocalcemia     ONSET DATE: 01/14/2022 (CVA Sept 2016)   REFERRING DIAG: I69.30 (ICD-10-CM) - Unspecified sequelae of cerebral infarction 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, right hand   THERAPY DIAG:  Hemiplegia and hemiparesis following cerebral infarction affecting right dominant side (HCC)  Muscle weakness (generalized)  Other lack of coordination  Stiffness of right shoulder, not elsewhere classified  Stiffness  of right elbow, not elsewhere classified  Stiffness of right wrist, not elsewhere classified  Stiffness of right hand, not elsewhere classified   PERTINENT HISTORY: L thalamic/BG hemorrhage w/ R hemiparesis in 2016; HTN, HLD, moderate persistent asthma  PRECAUTIONS: Fall  SUBJECTIVE:   SUBJECTIVE STATEMENT: Pt reports she has not looked into returning to see her neurologist or options for spasticity management discussed in a previous session. Also reports binocular double vision and "dull" vision in her R eye w/ L eye is covered; has previously worn a full occlusion patch  PAIN: Are you having pain? No   OBJECTIVE:   UE ROM - per evaluation on 01/24/22       Right AROM Right PROM  Shoulder flexion 80 95  Shoulder abduction 75 85  Elbow flexion 100   Elbow extension -20 -5  Wrist flexion   30  Wrist extension -30 40  (Blank rows = not tested)  VISION ASSESSMENT: Impaired Tracking/Visual pursuits: reports increased double vision w/ all tracking toward R side Saccades: decreased speed of saccadic movements Convergence: WFL Visual Fields: no apparent deficits Diplopia assessment: objects split on top of one another and present all the time/all directions   TODAY'S TREATMENT: OT performed gentle PROM of isolated wrist extension w/ fingers flexed and composite MPJ extension, incorporating passive stretch held at end range to increase ROM for potential increase in motor function. Able to achieve approx 6% of wrist ext and MPJ extension to  approx 50 degrees of ext only while maintaining wrist in flexion; pt tolerated exercise w/out discomfort; PROM, particularly of finger ext, limited by hypertonicity Facilitation of self-PROM, holding stretch at end-range, of isolated MPJ extension, supporting along length of finger; completed x5 w/ each finger. OT provided initial demonstration, verbal cues for pacing and application of force, as well as pertinent education  02/01/22 Continued  orthosis management (rolled up washcloth) Reviewed prayer stretch and AAROM of shoulder flex Dowel exercises (shoulder flex, chest press, int/ext rot, abd/add)  01/30/22 Closed-chain AAROM of BUE shoulder flexion to chest height w/ hands clasped completed in sitting PROM of composite wrist and hand extension incorporating passive stretch held at end range to increase ROM and decrease joint stiffness for potential increase in motor function. self-PROM of wrist extension Orthosis management    PATIENT EDUCATION: Continued condition-specific education, particularly regarding hypertonicity and spasticity. Also facilitated throughout discussion related to diplopia and potential interventions for management; OT reiterated benefit of follow-up w/ neurologist and/or optometry/ophthalmology and reviewed interpretation of informal visual screening Person educated: Patient Education method: Explanation Education comprehension: needs further education    HOME EXERCISE PROGRAM: MedBridge Code: [id number] Supine Shoulder Flexion AAROM with Hands Clasped  - 1-2 x daily - 1 sets - 10 reps - 2-3 sec hold Wrist Prayer Stretch  - 1-2 x daily - 1 sets - 10 reps - 2-3 hold Seated Finger MP Extension PROM  - 1-2 x daily - 3-5 reps - 5-10 sec hold      GOALS: Goals reviewed with patient? Yes   SHORT TERM GOALS: Target date:  02/22/22  STG  Status:  1 Pt will tolerate HEP/stretching program for RUE Baseline: No HEP at this time On-going  2 Pt will demonstrate improved active elbow extension to -10* to increase functional use Baseline: AROM -20 PROM -5 On-going  3 Pt will demonstrate improved shoulder flexion to 90* to increase functional use Baseline: 80* On-going      LONG TERM GOALS: Target date:  03/22/22  LTG  Status:  1 Pt will tolerate advanced HEP/stretching program for RUE Baseline: No HEP at this time On-going  2 Pt will demonstrate improved active elbow extension to neutral to increase  functional use Baseline: -20* On-going  3 Pt will demonstrate improved shoulder flexion to 100* to increase functional use Baseline: 80* On-going  4 Pt will demonstrate improved UE functional use/functional grasp for ADLs as evidenced by increasing box/ blocks score by 3 blocks with RUE Baseline: 5 On-going  5 Pt will be independent with splint wear and care PRN Baseline: TBD On-going      ASSESSMENT:   CLINICAL IMPRESSION: Session today focused mostly on continued condition-specific education, progression of HEP designed for stretching, and discussion related to diplopia and reported visual deficits. 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 OT provided pertinent education and recommended follow-up w/ PCP and/or neurology to address this as well as medical intervention for spasticity as flexor synergy pattern is significantly limiting to functional use of RUE, particularly grasp and release and functional reach. OT also reviewed that goal for spasticity management in therapy is to maintain current ROM/prevent contracture from worsening vs "fixing" ROM to be WNL and pt verbalized understanding. Additionally of note, pt consistently does not wear her AFO, demonstrating notable knee hyperextension w/ any weight shifting toward R side w/ OT reviewing benefit of support provided via AFO.   PERFORMANCE DEFICITS in  functional skills including ADLs, IADLs, coordination, dexterity, sensation, tone, ROM, strength, pain, flexibility, GMC, and UE functional use.    IMPAIRMENTS are limiting patient from ADLs and IADLs.    COMORBIDITIES may have co-morbidities  that affects occupational performance. Patient will benefit from skilled OT to address above impairments and improve overall function.      PLAN: OT FREQUENCY: 2x/week   OT DURATION: 8 weeks   PLANNED INTERVENTIONS: self care/ADL training, therapeutic  exercise, therapeutic activity, neuromuscular re-education, manual therapy, passive range of motion, balance training, functional mobility training, splinting, electrical stimulation, ultrasound, moist heat, cryotherapy, patient/family education, and DME and/or AE instructions   RECOMMENDED OTHER SERVICES: NA   CONSULTED AND AGREED WITH PLAN OF CARE: Patient   PLAN FOR NEXT SESSION: Further assess diplopia and trial partial occlusion prn; continue to address RUE ROM and self-stretching   Rosie Fate, MSOT, OTR/L 02/06/2022, 12:02 PM

## 2022-02-08 ENCOUNTER — Ambulatory Visit: Payer: Medicare Other | Admitting: Occupational Therapy

## 2022-02-08 DIAGNOSIS — M25641 Stiffness of right hand, not elsewhere classified: Secondary | ICD-10-CM

## 2022-02-08 DIAGNOSIS — M6281 Muscle weakness (generalized): Secondary | ICD-10-CM

## 2022-02-08 DIAGNOSIS — M25621 Stiffness of right elbow, not elsewhere classified: Secondary | ICD-10-CM

## 2022-02-08 DIAGNOSIS — I69351 Hemiplegia and hemiparesis following cerebral infarction affecting right dominant side: Secondary | ICD-10-CM

## 2022-02-08 DIAGNOSIS — M25611 Stiffness of right shoulder, not elsewhere classified: Secondary | ICD-10-CM

## 2022-02-08 DIAGNOSIS — M25631 Stiffness of right wrist, not elsewhere classified: Secondary | ICD-10-CM

## 2022-02-08 NOTE — Therapy (Signed)
OUTPATIENT OCCUPATIONAL THERAPY TREATMENT NOTE   Patient Name: Katie Nichols MRN: [id number] DOB:1971-08-14, 51 y.o., female Today's Date: 02/08/2022  PCP: Stevphen Rochester, MD   REFERRING PROVIDER: Stevphen Rochester, MD   END OF SESSION:   OT End of Session - 02/08/22 0937     Visit Number 5    Number of Visits 17    Date for OT Re-Evaluation 03/22/22    Authorization Type Medicare A&B    Authorization - Visit Number 5    Progress Note Due on Visit 10    OT Start Time 0935    OT Stop Time 1016    OT Time Calculation (min) 41 min    Activity Tolerance Patient tolerated treatment well    Behavior During Therapy WFL for tasks assessed/performed            Past Medical History:  Diagnosis Date   Asthma    Hyperlipidemia    Hypertension    Stroke [facility])    intracerebral hemorrhage   No past surgical history on file. 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) 04/16/2016   HLD (hyperlipidemia) 04/16/2016   Right spastic hemiparesis (HCC) 04/16/2016   Left hemiparesis (HCC) 11/13/2015   Renal artery stenosis (HCC) 08/10/2015   Hypertension secondary to other renal disorders 08/10/2015   Right hemiplegia (HCC) 06/27/2015   HTN (hypertension), malignant    Acute respiratory failure with hypoxemia (HCC) 06/20/2015   ICH (intracerebral hemorrhage) (HCC) 06/19/2015   Encounter for intubation    Hypertensive emergency    Ventilator dependent (HCC)    Hypocalcemia     ONSET DATE: 01/14/2022 (CVA Sept 2016)   REFERRING DIAG: I69.30 (ICD-10-CM) - Unspecified sequelae of cerebral infarction 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, right hand   THERAPY DIAG:  Hemiplegia and hemiparesis following cerebral infarction affecting right dominant side (HCC)  Muscle weakness (generalized)  Stiffness of right shoulder, not elsewhere classified  Stiffness of right elbow, not elsewhere  classified  Stiffness of right wrist, not elsewhere classified  Stiffness of right hand, not elsewhere classified   PERTINENT HISTORY: L thalamic/BG hemorrhage w/ R hemiparesis in 2016; HTN, HLD, moderate persistent asthma  PRECAUTIONS: Fall  SUBJECTIVE:   SUBJECTIVE STATEMENT: "It's amazing how just engaging in movement, in the right way, can make such a difference"  PAIN: Are you having pain? No   OBJECTIVE:   UE ROM - per evaluation on 01/24/22       Right AROM Right PROM  Shoulder flexion 80 95  Shoulder abduction 75 85  Elbow flexion 100   Elbow extension -20 -5  Wrist flexion   30  Wrist extension -30 40  (Blank rows = not tested)  VISION ASSESSMENT: Impaired Tracking/Visual pursuits: reports increased double vision w/ all tracking toward R side Saccades: decreased speed of saccadic movements Convergence: WFL Visual Fields: no apparent deficits Diplopia assessment: objects split on top of one another and present all the time/all directions   TODAY'S TREATMENT: PROM to L shoulder with focus on shoulder flexion, abduction, and external rotation.  Pt demonstrating decreased tolerance and mobility in external rotation > abduction and flexion. Therapist educating on proper alignment and positioning to attempt to achieve increased external rotation.  Pt unable to replicate movement without assistance. Instructed in cane exercises with focus on shoulder flexion with BUE, abduction, and flexion/extension with cane on floor to further facilitate increased ROM and stretch.  Pt required demonstration  and repetition for improved technique and carryover of education.  Pt continues to demonstrate limited elbow extension and shoulder flexion impacting movement and proper technique, especially with abduction exercise.     PATIENT EDUCATION: Continued condition-specific education, particularly regarding hypertonicity and spasticity. Also facilitated throughout discussion about  potential interventions for management; OT reiterated benefit of follow-up w/ neurologist and/or optometry/ophthalmology per previous discussion with other treating OT. Person educated: Patient Education method: Explanation Education comprehension: needs further education    HOME EXERCISE PROGRAM: MedBridge Code: [id number]       GOALS: Goals reviewed with patient? Yes   SHORT TERM GOALS: Target date:  02/22/22  STG  Status:  1 Pt will tolerate HEP/stretching program for RUE Baseline: No HEP at this time On-going  2 Pt will demonstrate improved active elbow extension to -10* to increase functional use Baseline: AROM -20 PROM -5 On-going  3 Pt will demonstrate improved shoulder flexion to 90* to increase functional use Baseline: 80* On-going      LONG TERM GOALS: Target date:  03/22/22  LTG  Status:  1 Pt will tolerate advanced HEP/stretching program for RUE Baseline: No HEP at this time On-going  2 Pt will demonstrate improved active elbow extension to neutral to increase functional use Baseline: -20* On-going  3 Pt will demonstrate improved shoulder flexion to 100* to increase functional use Baseline: 80* On-going  4 Pt will demonstrate improved UE functional use/functional grasp for ADLs as evidenced by increasing box/ blocks score by 3 blocks with RUE Baseline: 5 On-going  5 Pt will be independent with splint wear and care PRN Baseline: TBD On-going      ASSESSMENT:   CLINICAL IMPRESSION: Session today focused mostly on continued condition-specific education, progression of HEP designed for stretching, and reiterating prior discussion of possible treatments for spasticity. Therapist also reviewed that goal for spasticity management in therapy is to maintain current ROM/prevent contracture from worsening vs "fixing" ROM to be WNL and pt verbalized understanding. Therapist educated on modifications of movements with focus on safe technique and positioning to allow for proper  stretch and movement pattern. Pt reports wearing an eye patch occasionally, encouraged her to bring with her for a future session.     PERFORMANCE DEFICITS in functional skills including ADLs, IADLs, coordination, dexterity, sensation, tone, ROM, strength, pain, flexibility, GMC, and UE functional use.    IMPAIRMENTS are limiting patient from ADLs and IADLs.    COMORBIDITIES may have co-morbidities  that affects occupational performance. Patient will benefit from skilled OT to address above impairments and improve overall function.      PLAN: OT FREQUENCY: 2x/week   OT DURATION: 8 weeks   PLANNED INTERVENTIONS: self care/ADL training, therapeutic exercise, therapeutic activity, neuromuscular re-education, manual therapy, passive range of motion, balance training, functional mobility training, splinting, electrical stimulation, ultrasound, moist heat, cryotherapy, patient/family education, and DME and/or AE instructions   RECOMMENDED OTHER SERVICES: NA   CONSULTED AND AGREED WITH PLAN OF CARE: Patient   PLAN FOR NEXT SESSION:  Further assess diplopia and trial partial occlusion prn; continue to address RUE ROM and self-stretching   Rosalio Loud, OTR/L 02/08/2022, 9:38 AM

## 2022-02-13 ENCOUNTER — Ambulatory Visit: Payer: Medicare Other | Admitting: Occupational Therapy

## 2022-02-13 DIAGNOSIS — I69351 Hemiplegia and hemiparesis following cerebral infarction affecting right dominant side: Secondary | ICD-10-CM | POA: Diagnosis not present

## 2022-02-13 DIAGNOSIS — M25641 Stiffness of right hand, not elsewhere classified: Secondary | ICD-10-CM

## 2022-02-13 DIAGNOSIS — M25611 Stiffness of right shoulder, not elsewhere classified: Secondary | ICD-10-CM

## 2022-02-13 DIAGNOSIS — M25621 Stiffness of right elbow, not elsewhere classified: Secondary | ICD-10-CM

## 2022-02-13 DIAGNOSIS — M6281 Muscle weakness (generalized): Secondary | ICD-10-CM

## 2022-02-13 DIAGNOSIS — M25631 Stiffness of right wrist, not elsewhere classified: Secondary | ICD-10-CM

## 2022-02-13 NOTE — Therapy (Signed)
OUTPATIENT OCCUPATIONAL THERAPY TREATMENT NOTE   Patient Name: Katie Nichols MRN: [id number] DOB:11-14-1970, 51 y.o., female Today's Date: 02/13/2022  PCP: Stevphen Rochester, MD   REFERRING PROVIDER: Stevphen Rochester, MD   END OF SESSION:   OT End of Session - 02/13/22 1031     Visit Number 6    Number of Visits 17    Date for OT Re-Evaluation 03/22/22    Authorization Type Medicare A&B    Authorization - Visit Number 6    Progress Note Due on Visit 10    OT Start Time 1019    OT Stop Time 1100    OT Time Calculation (min) 41 min    Activity Tolerance Patient tolerated treatment well    Behavior During Therapy WFL for tasks assessed/performed             Past Medical History:  Diagnosis Date   Asthma    Hyperlipidemia    Hypertension    Stroke [facility])    intracerebral hemorrhage   No past surgical history on file. 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) 04/16/2016   HLD (hyperlipidemia) 04/16/2016   Right spastic hemiparesis (HCC) 04/16/2016   Left hemiparesis (HCC) 11/13/2015   Renal artery stenosis (HCC) 08/10/2015   Hypertension secondary to other renal disorders 08/10/2015   Right hemiplegia (HCC) 06/27/2015   HTN (hypertension), malignant    Acute respiratory failure with hypoxemia (HCC) 06/20/2015   ICH (intracerebral hemorrhage) (HCC) 06/19/2015   Encounter for intubation    Hypertensive emergency    Ventilator dependent (HCC)    Hypocalcemia     ONSET DATE: 01/14/2022 (CVA Sept 2016)   REFERRING DIAG: I69.30 (ICD-10-CM) - Unspecified sequelae of cerebral infarction 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, right hand   THERAPY DIAG:  Hemiplegia and hemiparesis following cerebral infarction affecting right dominant side (HCC)  Muscle weakness (generalized)  Stiffness of right shoulder, not elsewhere classified  Stiffness of right elbow, not elsewhere  classified  Stiffness of right wrist, not elsewhere classified  Stiffness of right hand, not elsewhere classified   PERTINENT HISTORY: L thalamic/BG hemorrhage w/ R hemiparesis in 2016; HTN, HLD, moderate persistent asthma  PRECAUTIONS: Fall  SUBJECTIVE:   SUBJECTIVE STATEMENT: "I drove with my AFO on"  PAIN: Are you having pain? No   OBJECTIVE:   UE ROM - per evaluation on 01/24/22       Right AROM Right PROM  Shoulder flexion 80 95  Shoulder abduction 75 85  Elbow flexion 100   Elbow extension -20 -5  Wrist flexion   30  Wrist extension -30 40  (Blank rows = not tested)  VISION ASSESSMENT: Impaired Tracking/Visual pursuits: reports increased double vision w/ all tracking toward R side Saccades: decreased speed of saccadic movements Convergence: WFL Visual Fields: no apparent deficits Diplopia assessment: objects split on top of one another and present all the time/all directions   TODAY'S TREATMENT: Reviewed cane exercises with cues for hand placement and improved technique. Therapist providing tactile cues and demonstration for hand placement and technique. Discussed pain v stretch with movements as well as expectations of therapy. WB on Half ball: Engaged in WB through RUE on half ball to increase tolerance of WB through RUE to facilitate increased shoulder flexion/extension; elbow, wrist, and finger extension.  Therapist manually opening pt's hand to place on half ball prior to engaging in weight shifting.  Pt tolerated positioning and even reports  that it feels good to have her hand open.  Engaged in reaching with LUE in various planes for ring and then crossing midline to place ring on target while increasing WB through RUE.  Pt demonstrating increased finger opening post WB activity and reports wrist and hand not as tight.    PATIENT EDUCATION: Continued condition-specific education, particularly regarding hypertonicity and spasticity. Also facilitated  throughout discussion about potential interventions for management; OT reiterated benefit of follow-up w/ neurologist v PM&R for further medical management of spasticity. Person educated: Patient Education method: Explanation Education comprehension: needs further education    HOME EXERCISE PROGRAM: MedBridge Code: [email or web address]       GOALS: Goals reviewed with patient? Yes   SHORT TERM GOALS: Target date:  02/22/22  STG  Status:  1 Pt will tolerate HEP/stretching program for RUE Baseline: No HEP at this time On-going  2 Pt will demonstrate improved active elbow extension to -10* to increase functional use Baseline: AROM -20 PROM -5 On-going  3 Pt will demonstrate improved shoulder flexion to 90* to increase functional use Baseline: 80* On-going      LONG TERM GOALS: Target date:  03/22/22  LTG  Status:  1 Pt will tolerate advanced HEP/stretching program for RUE Baseline: No HEP at this time On-going  2 Pt will demonstrate improved active elbow extension to neutral to increase functional use Baseline: -20* On-going  3 Pt will demonstrate improved shoulder flexion to 100* to increase functional use Baseline: 80* On-going  4 Pt will demonstrate improved UE functional use/functional grasp for ADLs as evidenced by increasing box/ blocks score by 3 blocks with RUE Baseline: 5 On-going  5 Pt will be independent with splint wear and care PRN Baseline: TBD On-going      ASSESSMENT:   CLINICAL IMPRESSION: Pt demonstrates good carryover of exercises, reporting increased access with exercises on her phone as she can then access them at any time, she also feels that the videos help ensure she completes exercises appropriately.  Therapist reiterated previous discussions regarding additional medical management, PM&R vs neurologist, for management of hypertonicity and spasticity.  Therapist educated pt on pain v stretch with movements as well as expectations of recovery with therapy only vs with  medical management.  Pt tolerated WB through RUE and demonstrated improved hand opening.  Pt reports understanding of importance of continued engagement in exercises/stretches to maintain mobility.   PERFORMANCE DEFICITS in functional skills including ADLs, IADLs, coordination, dexterity, sensation, tone, ROM, strength, pain, flexibility, GMC, and UE functional use.    IMPAIRMENTS are limiting patient from ADLs and IADLs.    COMORBIDITIES may have co-morbidities  that affects occupational performance. Patient will benefit from skilled OT to address above impairments and improve overall function.      PLAN: OT FREQUENCY: 2x/week   OT DURATION: 8 weeks   PLANNED INTERVENTIONS: self care/ADL training, therapeutic exercise, therapeutic activity, neuromuscular re-education, manual therapy, passive range of motion, balance training, functional mobility training, splinting, electrical stimulation, ultrasound, moist heat, cryotherapy, patient/family education, and DME and/or AE instructions   RECOMMENDED OTHER SERVICES: NA   CONSULTED AND AGREED WITH PLAN OF CARE: Patient   PLAN FOR NEXT SESSION: Further assess diplopia and trial partial occlusion prn; continue to address RUE ROM and self-stretching   Rosalio Loud, OTR/L 02/13/2022, 10:32 AM

## 2022-02-15 ENCOUNTER — Ambulatory Visit: Payer: Medicare Other | Admitting: Occupational Therapy

## 2022-02-15 VITALS — BP 131/91 | HR 73

## 2022-02-15 DIAGNOSIS — M25611 Stiffness of right shoulder, not elsewhere classified: Secondary | ICD-10-CM

## 2022-02-15 DIAGNOSIS — M25621 Stiffness of right elbow, not elsewhere classified: Secondary | ICD-10-CM

## 2022-02-15 DIAGNOSIS — I69351 Hemiplegia and hemiparesis following cerebral infarction affecting right dominant side: Secondary | ICD-10-CM

## 2022-02-15 DIAGNOSIS — M6281 Muscle weakness (generalized): Secondary | ICD-10-CM

## 2022-02-15 DIAGNOSIS — M25631 Stiffness of right wrist, not elsewhere classified: Secondary | ICD-10-CM

## 2022-02-15 DIAGNOSIS — M25641 Stiffness of right hand, not elsewhere classified: Secondary | ICD-10-CM

## 2022-02-15 NOTE — Therapy (Signed)
OUTPATIENT OCCUPATIONAL THERAPY TREATMENT NOTE   Patient Name: Katie Nichols MRN: [id number] DOB:[birth year], 51 y.o., female Today's Date: 02/15/2022  PCP: Jolinda Croak, MD   REFERRING PROVIDER: Jolinda Croak, MD   END OF SESSION:   OT End of Session - 02/15/22 1023     Visit Number 7    Number of Visits 17    Date for OT Re-Evaluation 03/22/22    Authorization Type Medicare A&B    Authorization - Visit Number 7    Progress Note Due on Visit 10    OT Start Time 1015    OT Stop Time 1100    OT Time Calculation (min) 45 min    Activity Tolerance Patient tolerated treatment well    Behavior During Therapy WFL for tasks assessed/performed              Past Medical History:  Diagnosis Date   Asthma    Hyperlipidemia    Hypertension    Stroke [facility])    intracerebral hemorrhage   No past surgical history on file. Patient Active Problem List   Diagnosis Date Noted   Nontraumatic subcortical hemorrhage of left cerebral hemisphere ([place]) 04/16/2016   HLD (hyperlipidemia) 04/16/2016   Right spastic hemiparesis ([place]) 04/16/2016   Left hemiparesis ([place]) 11/13/2015   Renal artery stenosis ([place]) 08/10/2015   Hypertension secondary to other renal disorders 08/10/2015   Right hemiplegia ([place]) 06/27/2015   HTN (hypertension), malignant    Acute respiratory failure with hypoxemia ([place]) 06/20/2015   ICH (intracerebral hemorrhage) ([place]) 06/19/2015   Encounter for intubation    Hypertensive emergency    Ventilator dependent ([place])    Hypocalcemia     ONSET DATE: 01/14/2022 (CVA Sept 2016)   REFERRING DIAG: I69.30 (ICD-10-CM) - Unspecified sequelae of cerebral infarction 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, right hand   THERAPY DIAG:  Hemiplegia and hemiparesis following cerebral infarction affecting right dominant side (HCC)  Muscle weakness (generalized)  Stiffness of right shoulder, not elsewhere classified  Stiffness of right elbow, not elsewhere  classified  Stiffness of right wrist, not elsewhere classified  Stiffness of right hand, not elsewhere classified   PERTINENT HISTORY: L thalamic/BG hemorrhage w/ R hemiparesis in 2016; HTN, HLD, moderate persistent asthma  PRECAUTIONS: Fall  SUBJECTIVE:   SUBJECTIVE STATEMENT: "My arm is really tensed up today"  PAIN: Are you having pain? No   OBJECTIVE:   Today's Vitals   02/15/22 1032  BP: (!) 131/91  Pulse: 73  SpO2: 97%     UE ROM - per evaluation on 01/24/22       Right AROM Right PROM  Shoulder flexion 80 95  Shoulder abduction 75 85  Elbow flexion 100   Elbow extension -20 -5  Wrist flexion   30  Wrist extension -30 40  (Blank rows = not tested)  VISION ASSESSMENT: Impaired Tracking/Visual pursuits: reports increased double vision w/ all tracking toward R side Saccades: decreased speed of saccadic movements Convergence: WFL Visual Fields: no apparent deficits Diplopia assessment: objects split on top of one another and present all the time/all directions   TODAY'S TREATMENT: PROM/AAROM with therapist providing stretching at shoulder, elbow, and wrist.  Therapist providing facilitation of movement at joints and stabilization at trunk to minimize compensatory movements.  With PROM/stretching noted mild decrease in tone, however post rest break to assess vitals noted tone same as prior to stretching.  Able to achieve ~95* shoulder flexion/abduction, full ROM at elbow, and minimal wrist flexion but extension ~  30*.   Utilized Saebo glide to facilitate shoulder abduction and elbow extension as well as shoulder flexion and elbow extension.  Therapist providing intermittent hand over hand assist with shoulder abduction and tactile cues and manual facilitation at shoulder to minimize compensatory strategy of shoulder hike.      PATIENT EDUCATION: Continued condition-specific education, particularly regarding hypertonicity and spasticity. Also facilitated  throughout discussion about potential interventions for management; OT reiterated benefit of follow-up w/ neurologist v PM&R for further medical management of spasticity. Person educated: Patient Education method: Explanation Education comprehension: needs further education    HOME EXERCISE PROGRAM: MedBridge Code: [id number]       GOALS: Goals reviewed with patient? Yes   SHORT TERM GOALS: Target date:  02/22/22  STG  Status:  1 Pt will tolerate HEP/stretching program for RUE Baseline: No HEP at this time On-going  2 Pt will demonstrate improved active elbow extension to -10* to increase functional use Baseline: AROM -20 PROM -5 On-going  3 Pt will demonstrate improved shoulder flexion to 90* to increase functional use Baseline: 80* On-going      LONG TERM GOALS: Target date:  03/22/22  LTG  Status:  1 Pt will tolerate advanced HEP/stretching program for RUE Baseline: No HEP at this time On-going  2 Pt will demonstrate improved active elbow extension to neutral to increase functional use Baseline: -20* On-going  3 Pt will demonstrate improved shoulder flexion to 100* to increase functional use Baseline: 80* On-going  4 Pt will demonstrate improved UE functional use/functional grasp for ADLs as evidenced by increasing box/ blocks score by 3 blocks with RUE Baseline: 5 On-going  5 Pt will be independent with splint wear and care PRN Baseline: TBD On-going      ASSESSMENT:   CLINICAL IMPRESSION: Pt continues to demonstrate hypertonicity and spasticity in RUE, reporting noticing increased "stiffness" over the past few days.  All vitals WNL.  During PROM, no obvious increase in tone from previous sessions, however therapist engaged in increased focus on stretching at each joint.  Pt able to engage in [facility] with use of saebo glide with therapist providing intermittent tactile cues, especially with attempts at shoulder flexion.  Pt continues to incorporate compensatory strategies of  shoulder hike to initiate and engage in any AAROM to AROM.   PERFORMANCE DEFICITS in functional skills including ADLs, IADLs, coordination, dexterity, sensation, tone, ROM, strength, pain, flexibility, GMC, and UE functional use.    IMPAIRMENTS are limiting patient from ADLs and IADLs.    COMORBIDITIES may have co-morbidities  that affects occupational performance. Patient will benefit from skilled OT to address above impairments and improve overall function.      PLAN: OT FREQUENCY: 2x/week   OT DURATION: 8 weeks   PLANNED INTERVENTIONS: self care/ADL training, therapeutic exercise, therapeutic activity, neuromuscular re-education, manual therapy, passive range of motion, balance training, functional mobility training, splinting, electrical stimulation, ultrasound, moist heat, cryotherapy, patient/family education, and DME and/or AE instructions   RECOMMENDED OTHER SERVICES: NA   CONSULTED AND AGREED WITH PLAN OF CARE: Patient   PLAN FOR NEXT SESSION: Further assess diplopia and trial partial occlusion prn; continue to address RUE ROM and self-stretching   Simonne Come, OTR/L 02/15/2022, 10:34 AM

## 2022-02-19 ENCOUNTER — Ambulatory Visit: Payer: Medicare Other | Admitting: Occupational Therapy

## 2022-02-19 DIAGNOSIS — M25631 Stiffness of right wrist, not elsewhere classified: Secondary | ICD-10-CM

## 2022-02-19 DIAGNOSIS — I69351 Hemiplegia and hemiparesis following cerebral infarction affecting right dominant side: Secondary | ICD-10-CM

## 2022-02-19 DIAGNOSIS — M6281 Muscle weakness (generalized): Secondary | ICD-10-CM

## 2022-02-19 DIAGNOSIS — M25641 Stiffness of right hand, not elsewhere classified: Secondary | ICD-10-CM

## 2022-02-19 DIAGNOSIS — R278 Other lack of coordination: Secondary | ICD-10-CM

## 2022-02-19 DIAGNOSIS — M25611 Stiffness of right shoulder, not elsewhere classified: Secondary | ICD-10-CM

## 2022-02-19 DIAGNOSIS — M25621 Stiffness of right elbow, not elsewhere classified: Secondary | ICD-10-CM

## 2022-02-19 NOTE — Therapy (Signed)
OUTPATIENT OCCUPATIONAL THERAPY TREATMENT NOTE   Patient Name: Katie Nichols MRN: [id number] DOB:January 09, 1971, 51 y.o., female Today's Date: 02/19/2022  PCP: Stevphen Rochester, MD   REFERRING PROVIDER: Stevphen Rochester, MD   END OF SESSION:   OT End of Session - 02/19/22 0939     Visit Number 8    Number of Visits 17    Date for OT Re-Evaluation 03/22/22    Authorization Type Medicare A&B    Authorization - Visit Number 8    Progress Note Due on Visit 10    OT Start Time 0935    OT Stop Time 1015    OT Time Calculation (min) 40 min    Activity Tolerance Patient tolerated treatment well    Behavior During Therapy WFL for tasks assessed/performed               Past Medical History:  Diagnosis Date   Asthma    Hyperlipidemia    Hypertension    Stroke [facility])    intracerebral hemorrhage   No past surgical history on file. 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) 04/16/2016   HLD (hyperlipidemia) 04/16/2016   Right spastic hemiparesis (HCC) 04/16/2016   Left hemiparesis (HCC) 11/13/2015   Renal artery stenosis (HCC) 08/10/2015   Hypertension secondary to other renal disorders 08/10/2015   Right hemiplegia (HCC) 06/27/2015   HTN (hypertension), malignant    Acute respiratory failure with hypoxemia (HCC) 06/20/2015   ICH (intracerebral hemorrhage) (HCC) 06/19/2015   Encounter for intubation    Hypertensive emergency    Ventilator dependent (HCC)    Hypocalcemia     ONSET DATE: 01/14/2022 (CVA Sept 2016)   REFERRING DIAG: I69.30 (ICD-10-CM) - Unspecified sequelae of cerebral infarction 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, right hand   THERAPY DIAG:  Hemiplegia and hemiparesis following cerebral infarction affecting right dominant side (HCC)  Muscle weakness (generalized)  Stiffness of right shoulder, not elsewhere classified  Stiffness of right elbow, not elsewhere  classified  Stiffness of right wrist, not elsewhere classified  Stiffness of right hand, not elsewhere classified  Other lack of coordination   PERTINENT HISTORY: L thalamic/BG hemorrhage w/ R hemiparesis in 2016; HTN, HLD, moderate persistent asthma  PRECAUTIONS: Fall  SUBJECTIVE:   SUBJECTIVE STATEMENT: "I'm feeling so much better today"  PAIN: Are you having pain? No   OBJECTIVE:   There were no vitals filed for this visit.    UE ROM - per evaluation on 01/24/22       Right AROM Right PROM  Shoulder flexion 80 95  Shoulder abduction 75 85  Elbow flexion 100   Elbow extension -20 -5  Wrist flexion   30  Wrist extension -30 40  (Blank rows = not tested)  VISION ASSESSMENT: Impaired Tracking/Visual pursuits: reports increased double vision w/ all tracking toward R side Saccades: decreased speed of saccadic movements Convergence: WFL Visual Fields: no apparent deficits Diplopia assessment: objects split on top of one another and present all the time/all directions   TODAY'S TREATMENT: Reiterated recommendation for use of alternating eye patch for diplopia as pt with binocular diplopia in L visual field > R visual field.  Attempted to utilize [place] string to focus on increased awareness of and addressing diplopia.  Pt reports unable to make eyes to focus on bead to make it just one.  Double vision split vertically.  Therapist modified task to having pt utilize 2" A  on paper with pt able to make A at midline without diplopia.  Pt reports onset of diplopia to R and L of midline with increased diplopia to L.  Therapist cueing pt to move target slowly into L and R visual fields to challenge vision.  Pt able to increase ability to make the target just one just to the R of midline and unable in L visual field.  Therapist encouraged pt to continue to engage in this exercise at home with focus on moving target around visual field slowly to allow eyes to focus on target and  attempt to focus eyes to decrease diplopia.   Pt reports modifications that she has made to increase her ability to read, watch tv, and even drive with persistent double vision.      PATIENT EDUCATION: Continued condition-specific education, focusing on diplopia this session. Person educated: Patient Education method: Explanation Education comprehension: needs further education    HOME EXERCISE PROGRAM: MedBridge Code: [id number]       GOALS: Goals reviewed with patient? Yes   SHORT TERM GOALS: Target date:  02/22/22  STG  Status:  1 Pt will tolerate HEP/stretching program for RUE Baseline: No HEP at this time On-going  2 Pt will demonstrate improved active elbow extension to -10* to increase functional use Baseline: AROM -20 PROM -5 On-going  3 Pt will demonstrate improved shoulder flexion to 90* to increase functional use Baseline: 80* On-going      LONG TERM GOALS: Target date:  03/22/22  LTG  Status:  1 Pt will tolerate advanced HEP/stretching program for RUE Baseline: No HEP at this time On-going  2 Pt will demonstrate improved active elbow extension to neutral to increase functional use Baseline: -20* On-going  3 Pt will demonstrate improved shoulder flexion to 100* to increase functional use Baseline: 80* On-going  4 Pt will demonstrate improved UE functional use/functional grasp for ADLs as evidenced by increasing box/ blocks score by 3 blocks with RUE Baseline: 5 On-going  5 Pt will be independent with splint wear and care PRN Baseline: TBD On-going      ASSESSMENT:   CLINICAL IMPRESSION: Session with focus on further visual assessment and focus on diplopia exercise.  Pt continues to report inconsistent diplopia at midline, however diplopia to R and L of midline.  Pt able to engage in exercise with focus on making eyes work together to decrease diplopia in visual field.  Pt able to report decreased diplopia inconsistently in R visual field, but persistent diplopia  in L visual field.  Pt will benefit from continued use of patching intermittently at home as well as use of visual exercises.     PERFORMANCE DEFICITS in functional skills including ADLs, IADLs, coordination, dexterity, sensation, tone, ROM, strength, pain, flexibility, GMC, and UE functional use.    IMPAIRMENTS are limiting patient from ADLs and IADLs.    COMORBIDITIES may have co-morbidities  that affects occupational performance. Patient will benefit from skilled OT to address above impairments and improve overall function.      PLAN: OT FREQUENCY: 2x/week   OT DURATION: 8 weeks   PLANNED INTERVENTIONS: self care/ADL training, therapeutic exercise, therapeutic activity, neuromuscular re-education, manual therapy, passive range of motion, balance training, functional mobility training, splinting, electrical stimulation, ultrasound, moist heat, cryotherapy, patient/family education, and DME and/or AE instructions   RECOMMENDED OTHER SERVICES: NA   CONSULTED AND AGREED WITH PLAN OF CARE: Patient   PLAN FOR NEXT SESSION: Further assess diplopia and trial partial occlusion prn; continue  to address RUE ROM and self-stretching   Rosalio LoudSarah Myrah Strawderman, OTR/L 02/19/2022, 9:39 AM

## 2022-02-21 ENCOUNTER — Ambulatory Visit: Payer: Medicare Other | Attending: Family Medicine | Admitting: Occupational Therapy

## 2022-02-21 DIAGNOSIS — M25631 Stiffness of right wrist, not elsewhere classified: Secondary | ICD-10-CM | POA: Insufficient documentation

## 2022-02-21 DIAGNOSIS — I69351 Hemiplegia and hemiparesis following cerebral infarction affecting right dominant side: Secondary | ICD-10-CM | POA: Diagnosis present

## 2022-02-21 DIAGNOSIS — M6281 Muscle weakness (generalized): Secondary | ICD-10-CM | POA: Diagnosis present

## 2022-02-21 DIAGNOSIS — M25621 Stiffness of right elbow, not elsewhere classified: Secondary | ICD-10-CM | POA: Insufficient documentation

## 2022-02-21 DIAGNOSIS — M25641 Stiffness of right hand, not elsewhere classified: Secondary | ICD-10-CM | POA: Diagnosis present

## 2022-02-21 DIAGNOSIS — R278 Other lack of coordination: Secondary | ICD-10-CM | POA: Diagnosis present

## 2022-02-21 DIAGNOSIS — M25611 Stiffness of right shoulder, not elsewhere classified: Secondary | ICD-10-CM | POA: Diagnosis present

## 2022-02-21 NOTE — Therapy (Signed)
OUTPATIENT OCCUPATIONAL THERAPY TREATMENT NOTE   Patient Name: Katie Nichols MRN: [id number] DOB:11/15/1970, 51 y.o., female Today's Date: 02/21/2022  PCP: Stevphen Rochester, MD   REFERRING PROVIDER: Stevphen Rochester, MD   END OF SESSION:   OT End of Session - 02/21/22 0947     Visit Number 9    Number of Visits 17    Date for OT Re-Evaluation 03/22/22    Authorization Type Medicare A&B    Authorization - Visit Number 9    Progress Note Due on Visit 10    OT Start Time 0933    OT Stop Time 1015    OT Time Calculation (min) 42 min    Activity Tolerance Patient tolerated treatment well    Behavior During Therapy WFL for tasks assessed/performed                Past Medical History:  Diagnosis Date   Asthma    Hyperlipidemia    Hypertension    Stroke [facility])    intracerebral hemorrhage   No past surgical history on file. 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) 04/16/2016   HLD (hyperlipidemia) 04/16/2016   Right spastic hemiparesis (HCC) 04/16/2016   Left hemiparesis (HCC) 11/13/2015   Renal artery stenosis (HCC) 08/10/2015   Hypertension secondary to other renal disorders 08/10/2015   Right hemiplegia (HCC) 06/27/2015   HTN (hypertension), malignant    Acute respiratory failure with hypoxemia (HCC) 06/20/2015   ICH (intracerebral hemorrhage) (HCC) 06/19/2015   Encounter for intubation    Hypertensive emergency    Ventilator dependent (HCC)    Hypocalcemia     ONSET DATE: 01/14/2022 (CVA Sept 2016)   REFERRING DIAG: I69.30 (ICD-10-CM) - Unspecified sequelae of cerebral infarction 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, right hand   THERAPY DIAG:  Hemiplegia and hemiparesis following cerebral infarction affecting right dominant side (HCC)  Muscle weakness (generalized)  Stiffness of right shoulder, not elsewhere classified  Stiffness of right elbow, not elsewhere  classified  Stiffness of right wrist, not elsewhere classified   PERTINENT HISTORY: L thalamic/BG hemorrhage w/ R hemiparesis in 2016; HTN, HLD, moderate persistent asthma  PRECAUTIONS: Fall  SUBJECTIVE:   SUBJECTIVE STATEMENT: "I'm just a little sore from doing some cleaning at home."  PAIN: Are you having pain? No   OBJECTIVE:   There were no vitals filed for this visit.    UE ROM     Right AROM 01/24/22 Right  AROM 02/21/22 Right PROM  Shoulder flexion 80 84 95  Shoulder abduction 75 75 85  Elbow flexion 100 100   Elbow extension -20 -10 -5  Wrist flexion    30  Wrist extension 30 40 40  (Blank rows = not tested)  VISION ASSESSMENT: Impaired Tracking/Visual pursuits: reports increased double vision w/ all tracking toward R side Saccades: decreased speed of saccadic movements Convergence: WFL Visual Fields: no apparent deficits Diplopia assessment: objects split on top of one another and present all the time/all directions   TODAY'S TREATMENT: AAROM: Utilized [place] glide to facilitate shoulder abduction and elbow extension as well as shoulder flexion and elbow extension.  Therapist providing intermittent hand over hand assist with shoulder abduction and tactile cues and manual facilitation at shoulder to minimize compensatory strategy of shoulder hike.  Pt demonstrating compensatory trunk movements and lateral weight shifting to increase shoulder abduction.  Pt fatigues quickly and demonstrates shoulder hike with attempts at increased ROM.  PATIENT EDUCATION: Continued condition-specific education, focusing on diplopia this session. Person educated: Patient Education method: Explanation Education comprehension: needs further education    HOME EXERCISE PROGRAM: MedBridge Code: [id number]       GOALS: Goals reviewed with patient? Yes   SHORT TERM GOALS: Target date:  02/22/22  STG  Status:  1 Pt will tolerate HEP/stretching program for  RUE Baseline: No HEP at this time Achieved  2 Pt will demonstrate improved active elbow extension to -10* to increase functional use Baseline: AROM -20 PROM -5 Achieved - AROM -10*  3 Pt will demonstrate improved shoulder flexion to 90* to increase functional use Baseline: 80* On-going - AROM 84* PROM 95*      LONG TERM GOALS: Target date:  03/22/22  LTG  Status:  1 Pt will tolerate advanced HEP/stretching program for RUE Baseline: No HEP at this time On-going  2 Pt will demonstrate improved active elbow extension to neutral to increase functional use Baseline: -20* On-going  3 Pt will demonstrate improved shoulder flexion to 100* to increase functional use Baseline: 80* On-going  4 Pt will demonstrate improved UE functional use/functional grasp for ADLs as evidenced by increasing box/ blocks score by 3 blocks with RUE Baseline: 5 On-going  5 Pt will be independent with splint wear and care PRN Baseline: TBD On-going      ASSESSMENT:   CLINICAL IMPRESSION: Pt continues to demonstrate hypertonicity and spasticity in RUE.  Therapist again reiterating nature of OT and limitations in motor recovery due to length of time since CVA. Pt able to engage in [facility] with use of saebo glide with therapist providing intermittent tactile cues and providing visual target to increase AAROM.  Pt continues to incorporate compensatory strategies of shoulder hike and lateral weight shift to initiate and engage in any AAROM to AROM.   PERFORMANCE DEFICITS in functional skills including ADLs, IADLs, coordination, dexterity, sensation, tone, ROM, strength, pain, flexibility, GMC, and UE functional use.    IMPAIRMENTS are limiting patient from ADLs and IADLs.    COMORBIDITIES may have co-morbidities  that affects occupational performance. Patient will benefit from skilled OT to address above impairments and improve overall function.      PLAN: OT FREQUENCY: 2x/week   OT DURATION: 8 weeks   PLANNED  INTERVENTIONS: self care/ADL training, therapeutic exercise, therapeutic activity, neuromuscular re-education, manual therapy, passive range of motion, balance training, functional mobility training, splinting, electrical stimulation, ultrasound, moist heat, cryotherapy, patient/family education, and DME and/or AE instructions   RECOMMENDED OTHER SERVICES: NA   CONSULTED AND AGREED WITH PLAN OF CARE: Patient   PLAN FOR NEXT SESSION: Further assess diplopia and trial partial occlusion prn; continue to address RUE ROM and self-stretching   Rosalio Loud, OTR/L 02/21/2022, 9:48 AM

## 2022-02-26 ENCOUNTER — Ambulatory Visit: Payer: Medicare Other | Admitting: Occupational Therapy

## 2022-02-26 DIAGNOSIS — M25621 Stiffness of right elbow, not elsewhere classified: Secondary | ICD-10-CM

## 2022-02-26 DIAGNOSIS — I69351 Hemiplegia and hemiparesis following cerebral infarction affecting right dominant side: Secondary | ICD-10-CM | POA: Diagnosis not present

## 2022-02-26 DIAGNOSIS — M25611 Stiffness of right shoulder, not elsewhere classified: Secondary | ICD-10-CM

## 2022-02-26 DIAGNOSIS — M25641 Stiffness of right hand, not elsewhere classified: Secondary | ICD-10-CM

## 2022-02-26 DIAGNOSIS — M6281 Muscle weakness (generalized): Secondary | ICD-10-CM

## 2022-02-26 DIAGNOSIS — M25631 Stiffness of right wrist, not elsewhere classified: Secondary | ICD-10-CM

## 2022-02-26 NOTE — Therapy (Signed)
OUTPATIENT OCCUPATIONAL THERAPY TREATMENT NOTE   Patient Name: Katie Nichols MRN: [id number] DOB:May 25, 1971, 51 y.o., female Today's Date: 02/26/2022  PCP: Jolinda Croak, MD   REFERRING PROVIDER: Jolinda Croak, MD   Occupational Therapy Progress Note  Dates of Reporting Period: 01/24/22 to 02/26/22    END OF SESSION:   OT End of Session - 02/26/22 1204     Visit Number 10    Number of Visits 17    Date for OT Re-Evaluation 03/22/22    Authorization Type Medicare A&B    Authorization - Visit Number 10    Progress Note Due on Visit 10    OT Start Time 1020    OT Stop Time 1101    OT Time Calculation (min) 41 min    Activity Tolerance Patient tolerated treatment well    Behavior During Therapy WFL for tasks assessed/performed                 Past Medical History:  Diagnosis Date   Asthma    Hyperlipidemia    Hypertension    Stroke [place])    intracerebral hemorrhage   No past surgical history on file. Patient Active Problem List   Diagnosis Date Noted   Nontraumatic subcortical hemorrhage of left cerebral hemisphere ([place]) 04/16/2016   HLD (hyperlipidemia) 04/16/2016   Right spastic hemiparesis ([place]) 04/16/2016   Left hemiparesis ([place]) 11/13/2015   Renal artery stenosis ([place]) 08/10/2015   Hypertension secondary to other renal disorders 08/10/2015   Right hemiplegia ([place]) 06/27/2015   HTN (hypertension), malignant    Acute respiratory failure with hypoxemia ([place]) 06/20/2015   ICH (intracerebral hemorrhage) ([place]) 06/19/2015   Encounter for intubation    Hypertensive emergency    Ventilator dependent ([place])    Hypocalcemia     ONSET DATE: 01/14/2022 (CVA Sept 2016)   REFERRING DIAG: I69.30 (ICD-10-CM) - Unspecified sequelae of cerebral infarction 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, right hand   THERAPY DIAG:  Hemiplegia and hemiparesis following cerebral infarction affecting right dominant side (HCC)  Muscle weakness (generalized)  Stiffness of  right shoulder, not elsewhere classified  Stiffness of right elbow, not elsewhere classified  Stiffness of right wrist, not elsewhere classified  Stiffness of right hand, not elsewhere classified   PERTINENT HISTORY: L thalamic/BG hemorrhage w/ R hemiparesis in 2016; HTN, HLD, moderate persistent asthma  PRECAUTIONS: Fall  SUBJECTIVE:   SUBJECTIVE STATEMENT: "It's hot out there"  PAIN: Are you having pain? Yes: NPRS scale: 7/10 Pain location: R knee Pain description: sore, radiating Aggravating factors: mobility Relieving factors: sitting, wearing AFO   OBJECTIVE:   There were no vitals filed for this visit.    UE ROM     Right AROM 01/24/22 Right  AROM 02/21/22 Right PROM  Shoulder flexion 80 84 95  Shoulder abduction 75 75 85  Elbow flexion 100 100   Elbow extension -20 -10 -5  Wrist flexion    30  Wrist extension 30 40 40  (Blank rows = not tested)  VISION ASSESSMENT: Impaired Tracking/Visual pursuits: reports increased double vision w/ all tracking toward R side Saccades: decreased speed of saccadic movements Convergence: WFL Visual Fields: no apparent deficits Diplopia assessment: objects split on top of one another and present all the time/all directions   TODAY'S TREATMENT: AAROM: NMR exercises in supine for increased scapular support and gravity-minimized/eliminated positioning.  Engaged in shoulder flexion and elbow flexion/extension in supine position with dowel in BUE to increase symmetrical movements. Therapist providing cues for pt  to visually attend to RUE to increase mobility and symmetry, pt able to increase elbow flexion with visual attention to task. Therapist providing intermittent tactile cues to L elbow to further facilitate movement.  Engaged in shoulder flexion with dowel in BUE with focus on increased shoulder flexion. Pt able to achieve 95-100* shoulder flexion on R in supine.  Engaged in horizontal abduction/adduction with dowel with  focus on R shoulder abduction while maintaining elbow extended.  Pt demonstrating improved elbow extension and shoulder flexion in supine position. WB on Half ball: Engaged in WB through [place] on half ball to increase tolerance of WB through RUE to facilitate increased shoulder flexion/extension; elbow, wrist, and finger extension.  Therapist manually opening pt's hand to place on half ball prior to engaging in weight shifting.  Engaged in reaching with LUE in various planes for ring and then crossing midline to place ring on target while increasing WB through RUE.  Pt demonstrating increased finger opening post WB activity and reports wrist and hand not as tight.  Pt able to achieve prayer hands post WB activity, however continues to demonstrate increased tone with finger extension at MCP.  Engaged in PROM with stretching at MCP with pt able to achieve with wrist in neutral which is difficult for pt to achieve on her own.     PATIENT EDUCATION: Continued condition-specific education, focusing shoulder AAROM in gravity minimized position. Person educated: Patient Education method: Explanation Education comprehension: needs further education    HOME EXERCISE PROGRAM: MedBridge Code: [id number]       GOALS: Goals reviewed with patient? Yes   SHORT TERM GOALS: Target date:  02/22/22  STG  Status:  1 Pt will tolerate HEP/stretching program for RUE Baseline: No HEP at this time Achieved  2 Pt will demonstrate improved active elbow extension to -10* to increase functional use Baseline: AROM -20 PROM -5 Achieved - AROM -10*  3 Pt will demonstrate improved shoulder flexion to 90* to increase functional use Baseline: 80* On-going - AROM 84* PROM 95*      LONG TERM GOALS: Target date:  03/22/22  LTG  Status:  1 Pt will tolerate advanced HEP/stretching program for RUE Baseline: No HEP at this time On-going  2 Pt will demonstrate improved active elbow extension to neutral to increase functional  use Baseline: -20* On-going  3 Pt will demonstrate improved shoulder flexion to 100* to increase functional use Baseline: 80* On-going  4 Pt will demonstrate improved UE functional use/functional grasp for ADLs as evidenced by increasing box/ blocks score by 3 blocks with RUE Baseline: 5 On-going  5 Pt will be independent with splint wear and care PRN Baseline: TBD On-going      ASSESSMENT:   CLINICAL IMPRESSION: Pt is making minimal improvements in shoulder and elbow AROM with improvements noted as pt progressing from 20* to 10* elbow extension and 80* to 84* shoulder flexion.  Pt continues to be limited due to hypertonicity and spasticity in RUE.  Therapist has educated on recommendation for PM&R or Neurologist follow up to address possible medical intervention to aid in management of hypertonicity and spasticity.  Pt able to engage in [place] in supine with improved ease and decreased compensation with shoulder hike and trunk rotation.  Pt demonstrating increased tolerance to WB and stretch during supine and seated exercises this session.   PERFORMANCE DEFICITS in functional skills including ADLs, IADLs, coordination, dexterity, sensation, tone, ROM, strength, pain, flexibility, GMC, and UE functional use.    IMPAIRMENTS are  limiting patient from ADLs and IADLs.    COMORBIDITIES may have co-morbidities  that affects occupational performance. Patient will benefit from skilled OT to address above impairments and improve overall function.      PLAN: OT FREQUENCY: 2x/week   OT DURATION: 8 weeks   PLANNED INTERVENTIONS: self care/ADL training, therapeutic exercise, therapeutic activity, neuromuscular re-education, manual therapy, passive range of motion, balance training, functional mobility training, splinting, electrical stimulation, ultrasound, moist heat, cryotherapy, patient/family education, and DME and/or AE instructions   RECOMMENDED OTHER SERVICES: NA   CONSULTED AND AGREED WITH  PLAN OF CARE: Patient   PLAN FOR NEXT SESSION: Further assess diplopia and trial partial occlusion prn; continue to address RUE ROM and self-stretching   Simonne Come, OTR/L 02/26/2022, 12:05 PM

## 2022-02-28 ENCOUNTER — Ambulatory Visit: Payer: Medicare Other | Admitting: Occupational Therapy

## 2022-02-28 DIAGNOSIS — M25621 Stiffness of right elbow, not elsewhere classified: Secondary | ICD-10-CM

## 2022-02-28 DIAGNOSIS — M6281 Muscle weakness (generalized): Secondary | ICD-10-CM

## 2022-02-28 DIAGNOSIS — M25631 Stiffness of right wrist, not elsewhere classified: Secondary | ICD-10-CM

## 2022-02-28 DIAGNOSIS — I69351 Hemiplegia and hemiparesis following cerebral infarction affecting right dominant side: Secondary | ICD-10-CM | POA: Diagnosis not present

## 2022-02-28 DIAGNOSIS — M25611 Stiffness of right shoulder, not elsewhere classified: Secondary | ICD-10-CM

## 2022-02-28 DIAGNOSIS — M25641 Stiffness of right hand, not elsewhere classified: Secondary | ICD-10-CM

## 2022-02-28 NOTE — Therapy (Signed)
OUTPATIENT OCCUPATIONAL THERAPY TREATMENT NOTE   Patient Name: NHIA MCNICHOL MRN: [id number] DOB:[birth year], 51 y.o., female Today's Date: 02/28/2022  PCP: Jolinda Croak, MD   REFERRING PROVIDER: Jolinda Croak, MD     END OF SESSION:   OT End of Session - 02/28/22 1024     Visit Number 11    Number of Visits 17    Date for OT Re-Evaluation 03/22/22    Authorization Type Medicare A&B    Authorization - Visit Number 11    Progress Note Due on Visit 20    OT Start Time 1019    OT Stop Time 1100    OT Time Calculation (min) 41 min    Activity Tolerance Patient tolerated treatment well    Behavior During Therapy WFL for tasks assessed/performed                  Past Medical History:  Diagnosis Date   Asthma    Hyperlipidemia    Hypertension    Stroke [facility])    intracerebral hemorrhage   No past surgical history on file. Patient Active Problem List   Diagnosis Date Noted   Nontraumatic subcortical hemorrhage of left cerebral hemisphere ([place]) 04/16/2016   HLD (hyperlipidemia) 04/16/2016   Right spastic hemiparesis ([place]) 04/16/2016   Left hemiparesis ([place]) 11/13/2015   Renal artery stenosis ([place]) 08/10/2015   Hypertension secondary to other renal disorders 08/10/2015   Right hemiplegia ([place]) 06/27/2015   HTN (hypertension), malignant    Acute respiratory failure with hypoxemia ([place]) 06/20/2015   ICH (intracerebral hemorrhage) ([place]) 06/19/2015   Encounter for intubation    Hypertensive emergency    Ventilator dependent ([place])    Hypocalcemia     ONSET DATE: 01/14/2022 (CVA Sept 2016)   REFERRING DIAG: I69.30 (ICD-10-CM) - Unspecified sequelae of cerebral infarction 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, right hand   THERAPY DIAG:  Hemiplegia and hemiparesis following cerebral infarction affecting right dominant side (HCC)  Muscle weakness (generalized)  Stiffness of right shoulder, not elsewhere classified  Stiffness of right elbow, not elsewhere  classified  Stiffness of right wrist, not elsewhere classified  Stiffness of right hand, not elsewhere classified   PERTINENT HISTORY: L thalamic/BG hemorrhage w/ R hemiparesis in 2016; HTN, HLD, moderate persistent asthma  PRECAUTIONS: Fall  SUBJECTIVE:   SUBJECTIVE STATEMENT: "I had to take my car in yesterday, then sent me home in a Lyft."  PAIN: Are you having pain? Yes: NPRS scale: 4/10 Pain location: R knee Pain description: sore, radiating Aggravating factors: mobility Relieving factors: sitting, wearing AFO   OBJECTIVE:     UE ROM     Right AROM 01/24/22 Right  AROM 02/21/22 Right PROM  Shoulder flexion 80 84 95  Shoulder abduction 75 75 85  Elbow flexion 100 100   Elbow extension -20 -10 -5  Wrist flexion    30  Wrist extension 30 40 40  (Blank rows = not tested)  VISION ASSESSMENT: Impaired Tracking/Visual pursuits: reports increased double vision w/ all tracking toward R side Saccades: decreased speed of saccadic movements Convergence: WFL Visual Fields: no apparent deficits Diplopia assessment: objects split on top of one another and present all the time/all directions   TODAY'S TREATMENT: AAROM: elbow flexion and extension in sidelying with use of cane for UE support.  Pt demonstrating decreased grip on cane handle, requiring repositioning intermittently during task.  Pt able to achieve increased elbow flexion and extension to neutral in sidelying with use of repetition and  cane.  Engaged in shoulder flexion in sidelying with use of UE Ranger for increased support and positioning.  Pt demonstrating mild shoulder hike during shoulder flexion, min cues to draw attention to positioning. Shoulder extension and scapular retraction with mod multimodal cues with facilitation to achieve increased activation with proper technique.  Pt demonstrating difficulty with scapular retraction and shoulder extension, recruiting trunk rotation and abduction to initiate  movement. PROM: Therapist providing manual facilitation for finger extension.  Pt unable to achieve full extension without hyperextension of 3rd and 4th fingers. Engaged in PROM prior to placement of hand on UE Ranger due to decreased finger extension and hand opening, however able to achieve "good enough" placement to accommodate decreased finger extension while still ensuring safe positioning.      PATIENT EDUCATION: Continued condition-specific education, focusing shoulder AAROM in gravity minimized position. Person educated: Patient Education method: Explanation Education comprehension: needs further education    HOME EXERCISE PROGRAM: MedBridge Code: [id number]       GOALS: Goals reviewed with patient? Yes   SHORT TERM GOALS: Target date:  02/22/22  STG  Status:  1 Pt will tolerate HEP/stretching program for RUE Baseline: No HEP at this time Achieved  2 Pt will demonstrate improved active elbow extension to -10* to increase functional use Baseline: AROM -20 PROM -5 Achieved - AROM -10*  3 Pt will demonstrate improved shoulder flexion to 90* to increase functional use Baseline: 80* On-going - AROM 84* PROM 95*      LONG TERM GOALS: Target date:  03/22/22  LTG  Status:  1 Pt will tolerate advanced HEP/stretching program for RUE Baseline: No HEP at this time On-going  2 Pt will demonstrate improved active elbow extension to neutral to increase functional use Baseline: -20* On-going  3 Pt will demonstrate improved shoulder flexion to 100* to increase functional use Baseline: 80* On-going  4 Pt will demonstrate improved UE functional use/functional grasp for ADLs as evidenced by increasing box/ blocks score by 3 blocks with RUE Baseline: 5 On-going  5 Pt will be independent with splint wear and care PRN Baseline: TBD On-going      ASSESSMENT:   CLINICAL IMPRESSION: Pt continues to be limited due to hypertonicity and spasticity in RUE.  Pt able to engage in AAROM in  sidelying this session with improved ease, however noted increased compensation of shoulder hike and trunk rotation in sidelying position.  Therapist providing tactile cues and verbal cues to decrease compensatory movements.    PERFORMANCE DEFICITS in functional skills including ADLs, IADLs, coordination, dexterity, sensation, tone, ROM, strength, pain, flexibility, GMC, and UE functional use.    IMPAIRMENTS are limiting patient from ADLs and IADLs.    COMORBIDITIES may have co-morbidities  that affects occupational performance. Patient will benefit from skilled OT to address above impairments and improve overall function.      PLAN: OT FREQUENCY: 2x/week   OT DURATION: 8 weeks   PLANNED INTERVENTIONS: self care/ADL training, therapeutic exercise, therapeutic activity, neuromuscular re-education, manual therapy, passive range of motion, balance training, functional mobility training, splinting, electrical stimulation, ultrasound, moist heat, cryotherapy, patient/family education, and DME and/or AE instructions   RECOMMENDED OTHER SERVICES: NA   CONSULTED AND AGREED WITH PLAN OF CARE: Patient   PLAN FOR NEXT SESSION: Further assess diplopia and trial partial occlusion prn; continue to address RUE ROM and self-stretching   Simonne Come, OTR/L 02/28/2022, 10:25 AM

## 2022-03-05 ENCOUNTER — Ambulatory Visit: Payer: Medicare Other | Admitting: Occupational Therapy

## 2022-03-05 DIAGNOSIS — M25631 Stiffness of right wrist, not elsewhere classified: Secondary | ICD-10-CM

## 2022-03-05 DIAGNOSIS — M6281 Muscle weakness (generalized): Secondary | ICD-10-CM

## 2022-03-05 DIAGNOSIS — I69351 Hemiplegia and hemiparesis following cerebral infarction affecting right dominant side: Secondary | ICD-10-CM

## 2022-03-05 DIAGNOSIS — M25611 Stiffness of right shoulder, not elsewhere classified: Secondary | ICD-10-CM

## 2022-03-05 DIAGNOSIS — M25621 Stiffness of right elbow, not elsewhere classified: Secondary | ICD-10-CM

## 2022-03-05 NOTE — Therapy (Signed)
OUTPATIENT OCCUPATIONAL THERAPY TREATMENT NOTE   Patient Name: Katie Nichols MRN: [id number] DOB:May 06, 1971, 51 y.o., female Today's Date: 03/05/2022  PCP: Stevphen Rochester, MD   REFERRING PROVIDER: Stevphen Rochester, MD     END OF SESSION:   OT End of Session - 03/05/22 1119     Visit Number 12    Number of Visits 17    Date for OT Re-Evaluation 03/22/22    Authorization Type Medicare A&B    Authorization - Visit Number 12    Progress Note Due on Visit 20    OT Start Time 1021    OT Stop Time 1104    OT Time Calculation (min) 43 min    Activity Tolerance Patient tolerated treatment well    Behavior During Therapy WFL for tasks assessed/performed                   Past Medical History:  Diagnosis Date   Asthma    Hyperlipidemia    Hypertension    Stroke (HCC)    intracerebral hemorrhage   No past surgical history on file. 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) 04/16/2016   HLD (hyperlipidemia) 04/16/2016   Right spastic hemiparesis (HCC) 04/16/2016   Left hemiparesis (HCC) 11/13/2015   Renal artery stenosis (HCC) 08/10/2015   Hypertension secondary to other renal disorders 08/10/2015   Right hemiplegia (HCC) 06/27/2015   HTN (hypertension), malignant    Acute respiratory failure with hypoxemia (HCC) 06/20/2015   ICH (intracerebral hemorrhage) (HCC) 06/19/2015   Encounter for intubation    Hypertensive emergency    Ventilator dependent (HCC)    Hypocalcemia     ONSET DATE: 01/14/2022 (CVA Sept 2016)   REFERRING DIAG: I69.30 (ICD-10-CM) - Unspecified sequelae of cerebral infarction 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, right hand   THERAPY DIAG:  Hemiplegia and hemiparesis following cerebral infarction affecting right dominant side (HCC)  Muscle weakness (generalized)  Stiffness of right shoulder, not elsewhere classified  Stiffness of right elbow, not  elsewhere classified  Stiffness of right wrist, not elsewhere classified   PERTINENT HISTORY: L thalamic/BG hemorrhage w/ R hemiparesis in 2016; HTN, HLD, moderate persistent asthma  PRECAUTIONS: Fall  SUBJECTIVE:   SUBJECTIVE STATEMENT: "I keep waking up at 5:30."  PAIN: Are you having pain? Yes: NPRS scale: 3/10 Pain location: R knee Pain description: sore, radiating Aggravating factors: mobility Relieving factors: sitting, wearing AFO   OBJECTIVE:     UE ROM     Right AROM 01/24/22 Right  AROM 02/21/22 Right PROM  Shoulder flexion 80 84 95  Shoulder abduction 75 75 85  Elbow flexion 100 100   Elbow extension -20 -10 -5  Wrist flexion    30  Wrist extension 30 40 40  (Blank rows = not tested)  VISION ASSESSMENT: Impaired Tracking/Visual pursuits: reports increased double vision w/ all tracking toward R side Saccades: decreased speed of saccadic movements Convergence: WFL Visual Fields: no apparent deficits Diplopia assessment: objects split on top of one another and present all the time/all directions   TODAY'S TREATMENT: AAROM: shoulder flexion and elbow extension in sitting with cane to facilitate further ROM and stretch. Pt demonstrating decreased grip on cane handle, requiring repositioning intermittently during task.  Horizontal abduction/adduction with Sa[place]o glide.  Therapist providing moderate cues and intermittent tactile cues to decrease compensatory trunk movements while attempting to achieve increased horizontal abduction.  Chest press and shoulder flexion with  dowel rod held in BUE for improved feedback from intact side.  Therapist assisting pt with placement of hand on dowel for improved activation of movements.  Pt demonstrating shoulder hike during shoulder flexion, min cues to draw attention to positioning.     PATIENT EDUCATION: Continued condition-specific education, focusing shoulder AAROM in gravity minimized position. Person educated:  Patient Education method: Explanation Education comprehension: needs further education    HOME EXERCISE PROGRAM: MedBridge Code: [id number]       GOALS: Goals reviewed with patient? Yes   SHORT TERM GOALS: Target date:  02/22/22  STG  Status:  1 Pt will tolerate HEP/stretching program for RUE Baseline: No HEP at this time Achieved  2 Pt will demonstrate improved active elbow extension to -10* to increase functional use Baseline: AROM -20 PROM -5 Achieved - AROM -10*  3 Pt will demonstrate improved shoulder flexion to 90* to increase functional use Baseline: 80* On-going - AROM 84* PROM 95*      LONG TERM GOALS: Target date:  03/22/22  LTG  Status:  1 Pt will tolerate advanced HEP/stretching program for RUE Baseline: No HEP at this time On-going  2 Pt will demonstrate improved active elbow extension to neutral to increase functional use Baseline: -20* On-going  3 Pt will demonstrate improved shoulder flexion to 100* to increase functional use Baseline: 80* On-going  4 Pt will demonstrate improved UE functional use/functional grasp for ADLs as evidenced by increasing box/ blocks score by 3 blocks with RUE Baseline: 5 On-going  5 Pt will be independent with splint wear and care PRN Baseline: TBD On-going      ASSESSMENT:   CLINICAL IMPRESSION: Pt continues to be limited due to hypertonicity and spasticity in RUE.  Pt able to engage in AAROM in sitting with use of cane, dowel, and [place]o glide to further facilitate shoulder flexion, horizontal abduction/adduction, and elbow extension.  Pt continues to benefit from multimodal cues for technique and to decrease compensatory movements.    PERFORMANCE DEFICITS in functional skills including ADLs, IADLs, coordination, dexterity, sensation, tone, ROM, strength, pain, flexibility, GMC, and UE functional use.    IMPAIRMENTS are limiting patient from ADLs and IADLs.    COMORBIDITIES may have co-morbidities  that affects occupational  performance. Patient will benefit from skilled OT to address above impairments and improve overall function.      PLAN: OT FREQUENCY: 2x/week   OT DURATION: 8 weeks   PLANNED INTERVENTIONS: self care/ADL training, therapeutic exercise, therapeutic activity, neuromuscular re-education, manual therapy, passive range of motion, balance training, functional mobility training, splinting, electrical stimulation, ultrasound, moist heat, cryotherapy, patient/family education, and DME and/or AE instructions   RECOMMENDED OTHER SERVICES: NA   CONSULTED AND AGREED WITH PLAN OF CARE: Patient   PLAN FOR NEXT SESSION: Further assess diplopia and trial partial occlusion prn; continue to address RUE ROM and self-stretching   Rosalio Loud, OTR/L 03/05/2022, 11:20 AM

## 2022-03-07 ENCOUNTER — Ambulatory Visit: Payer: Medicare Other | Admitting: Occupational Therapy

## 2022-03-07 DIAGNOSIS — I69351 Hemiplegia and hemiparesis following cerebral infarction affecting right dominant side: Secondary | ICD-10-CM

## 2022-03-07 DIAGNOSIS — M25631 Stiffness of right wrist, not elsewhere classified: Secondary | ICD-10-CM

## 2022-03-07 DIAGNOSIS — M25641 Stiffness of right hand, not elsewhere classified: Secondary | ICD-10-CM

## 2022-03-07 DIAGNOSIS — M25621 Stiffness of right elbow, not elsewhere classified: Secondary | ICD-10-CM

## 2022-03-07 DIAGNOSIS — M25611 Stiffness of right shoulder, not elsewhere classified: Secondary | ICD-10-CM

## 2022-03-07 DIAGNOSIS — M6281 Muscle weakness (generalized): Secondary | ICD-10-CM

## 2022-03-07 NOTE — Therapy (Signed)
OUTPATIENT OCCUPATIONAL THERAPY TREATMENT NOTE   Patient Name: Katie Nichols MRN: [id number] DOB:March 18, 1971, 51 y.o., female Today's Date: 03/07/2022  PCP: Stevphen Rochester, MD   REFERRING PROVIDER: Stevphen Rochester, MD     END OF SESSION:   OT End of Session - 03/07/22 1341     Visit Number 13    Number of Visits 17    Date for OT Re-Evaluation 03/22/22    Authorization Type Medicare A&B    Authorization - Visit Number 12    Progress Note Due on Visit 20    OT Start Time 1016    OT Stop Time 1101    OT Time Calculation (min) 45 min    Activity Tolerance Patient tolerated treatment well    Behavior During Therapy WFL for tasks assessed/performed                    Past Medical History:  Diagnosis Date   Asthma    Hyperlipidemia    Hypertension    Stroke [facility])    intracerebral hemorrhage   No past surgical history on file. 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) 04/16/2016   HLD (hyperlipidemia) 04/16/2016   Right spastic hemiparesis (HCC) 04/16/2016   Left hemiparesis (HCC) 11/13/2015   Renal artery stenosis (HCC) 08/10/2015   Hypertension secondary to other renal disorders 08/10/2015   Right hemiplegia (HCC) 06/27/2015   HTN (hypertension), malignant    Acute respiratory failure with hypoxemia (HCC) 06/20/2015   ICH (intracerebral hemorrhage) (HCC) 06/19/2015   Encounter for intubation    Hypertensive emergency    Ventilator dependent (HCC)    Hypocalcemia     ONSET DATE: 01/14/2022 (CVA Sept 2016)   REFERRING DIAG: I69.30 (ICD-10-CM) - Unspecified sequelae of cerebral infarction 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, right hand   THERAPY DIAG:  Hemiplegia and hemiparesis following cerebral infarction affecting right dominant side (HCC)  Muscle weakness (generalized)  Stiffness of right shoulder, not elsewhere classified  Stiffness of right elbow, not  elsewhere classified  Stiffness of right wrist, not elsewhere classified  Stiffness of right hand, not elsewhere classified   PERTINENT HISTORY: L thalamic/BG hemorrhage w/ R hemiparesis in 2016; HTN, HLD, moderate persistent asthma  PRECAUTIONS: Fall  SUBJECTIVE:   SUBJECTIVE STATEMENT: "I was lying in bed last night working on my arm and my brain said you are raising your arm so high"  PAIN: Are you having pain? No   OBJECTIVE:     UE ROM     Right AROM 01/24/22 Right  AROM 02/21/22 Right PROM  Shoulder flexion 80 84 95  Shoulder abduction 75 75 85  Elbow flexion 100 100   Elbow extension -20 -10 -5  Wrist flexion    30  Wrist extension 30 40 40  (Blank rows = not tested)  VISION ASSESSMENT: Impaired Tracking/Visual pursuits: reports increased double vision w/ all tracking toward R side Saccades: decreased speed of saccadic movements Convergence: WFL Visual Fields: no apparent deficits Diplopia assessment: objects split on top of one another and present all the time/all directions   TODAY'S TREATMENT: AAROM: shoulder flexion and abduction in supine.  Pt able to raise shoulder to ~90* demonstrating improved elbow extension during shoulder flexion.  Pt reports pain with attempts at shoulder abduction in supine, able to achieve ~30* before onset of pain.  Pt unable to maintain elbow straight with any attempts at shoulder abduction.  Utilized  dowel in supine to facilitate increased shoulder flexion.  Pt engaged in chest press and shoulder flexion in supine with dowel rod held in BUE for improved feedback and symmetry with intact side.  Pt demonstrating shoulder flexion ~110* in supine.  Pt continues to require setup for hand placement, demonstrating decreased hand opening and grasp this session. Pt demonstrating improved finger extension/opening post exercises, with decreased effort, even resting hand on thigh in extension.  Engaged in prayer stretch with improved wrist  extension and finger extension - demonstrating mild hyperextension in 3rd and 4th digits.    PATIENT EDUCATION: Continued condition-specific education, focusing shoulder AAROM in gravity minimized position. Person educated: Patient Education method: Explanation Education comprehension: needs further education    HOME EXERCISE PROGRAM: MedBridge Code: [id number]       GOALS: Goals reviewed with patient? Yes   SHORT TERM GOALS: Target date:  02/22/22  STG  Status:  1 Pt will tolerate HEP/stretching program for RUE Baseline: No HEP at this time Achieved  2 Pt will demonstrate improved active elbow extension to -10* to increase functional use Baseline: AROM -20 PROM -5 Achieved - AROM -10*  3 Pt will demonstrate improved shoulder flexion to 90* to increase functional use Baseline: 80* On-going - AROM 84* PROM 95*      LONG TERM GOALS: Target date:  03/22/22  LTG  Status:  1 Pt will tolerate advanced HEP/stretching program for RUE Baseline: No HEP at this time On-going  2 Pt will demonstrate improved active elbow extension to neutral to increase functional use Baseline: -20* On-going  3 Pt will demonstrate improved shoulder flexion to 100* to increase functional use Baseline: 80* On-going  4 Pt will demonstrate improved UE functional use/functional grasp for ADLs as evidenced by increasing box/ blocks score by 3 blocks with RUE Baseline: 5 On-going  5 Pt will be independent with splint wear and care PRN Baseline: TBD On-going      ASSESSMENT:   CLINICAL IMPRESSION: Pt reports utilizing RUE as gross assist to stabilizer with holding cucumber and avocado when cutting or peeling. Pt reports increased engagement in shoulder mobility in bed in sidelying, receptive to education this session in supine as well as with dowel in supine.  Pt continues to be limited due to hypertonicity and spasticity in RUE.  Pt continues to benefit from multimodal cues for technique and to decrease  compensatory movements.    PERFORMANCE DEFICITS in functional skills including ADLs, IADLs, coordination, dexterity, sensation, tone, ROM, strength, pain, flexibility, GMC, and UE functional use.    IMPAIRMENTS are limiting patient from ADLs and IADLs.    COMORBIDITIES may have co-morbidities  that affects occupational performance. Patient will benefit from skilled OT to address above impairments and improve overall function.      PLAN: OT FREQUENCY: 2x/week   OT DURATION: 8 weeks   PLANNED INTERVENTIONS: self care/ADL training, therapeutic exercise, therapeutic activity, neuromuscular re-education, manual therapy, passive range of motion, balance training, functional mobility training, splinting, electrical stimulation, ultrasound, moist heat, cryotherapy, patient/family education, and DME and/or AE instructions   RECOMMENDED OTHER SERVICES: NA   CONSULTED AND AGREED WITH PLAN OF CARE: Patient   PLAN FOR NEXT SESSION: Further assess diplopia and trial partial occlusion prn; continue to address RUE ROM and self-stretching   Rosalio Loud, OTR/L 03/07/2022, 1:42 PM

## 2022-03-13 ENCOUNTER — Encounter: Payer: Self-pay | Admitting: Occupational Therapy

## 2022-03-13 ENCOUNTER — Ambulatory Visit: Payer: Medicare Other | Admitting: Occupational Therapy

## 2022-03-13 DIAGNOSIS — I69351 Hemiplegia and hemiparesis following cerebral infarction affecting right dominant side: Secondary | ICD-10-CM

## 2022-03-13 DIAGNOSIS — M25641 Stiffness of right hand, not elsewhere classified: Secondary | ICD-10-CM

## 2022-03-13 DIAGNOSIS — M6281 Muscle weakness (generalized): Secondary | ICD-10-CM

## 2022-03-13 DIAGNOSIS — M25631 Stiffness of right wrist, not elsewhere classified: Secondary | ICD-10-CM

## 2022-03-13 DIAGNOSIS — M25611 Stiffness of right shoulder, not elsewhere classified: Secondary | ICD-10-CM

## 2022-03-13 DIAGNOSIS — R278 Other lack of coordination: Secondary | ICD-10-CM

## 2022-03-13 DIAGNOSIS — M25621 Stiffness of right elbow, not elsewhere classified: Secondary | ICD-10-CM

## 2022-03-13 NOTE — Therapy (Unsigned)
OUTPATIENT OCCUPATIONAL THERAPY TREATMENT NOTE   Patient Name: Katie Nichols MRN: [id number] DOB:April 26, 1971, 51 y.o., female Today's Date: 03/13/2022  PCP: Stevphen Rochester, MD   REFERRING PROVIDER: Stevphen Rochester, MD   END OF SESSION:   OT End of Session - 03/13/22 1026     Visit Number 14    Number of Visits 17    Date for OT Re-Evaluation 03/22/22    Authorization Type Medicare A&B    Authorization - Visit Number 13    Progress Note Due on Visit 20    OT Start Time 1017    OT Stop Time 1058    OT Time Calculation (min) 41 min    Activity Tolerance Patient tolerated treatment well    Behavior During Therapy WFL for tasks assessed/performed            Past Medical History:  Diagnosis Date   Asthma    Hyperlipidemia    Hypertension    Stroke [facility])    intracerebral hemorrhage   History reviewed. No pertinent surgical history. 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) 04/16/2016   HLD (hyperlipidemia) 04/16/2016   Right spastic hemiparesis (HCC) 04/16/2016   Left hemiparesis (HCC) 11/13/2015   Renal artery stenosis (HCC) 08/10/2015   Hypertension secondary to other renal disorders 08/10/2015   Right hemiplegia (HCC) 06/27/2015   HTN (hypertension), malignant    Acute respiratory failure with hypoxemia (HCC) 06/20/2015   ICH (intracerebral hemorrhage) (HCC) 06/19/2015   Encounter for intubation    Hypertensive emergency    Ventilator dependent (HCC)    Hypocalcemia     ONSET DATE: 01/14/2022 (CVA Sept 2016)   REFERRING DIAG: I69.30 (ICD-10-CM) - Unspecified sequelae of cerebral infarction 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, right hand   THERAPY DIAG:  Hemiplegia and hemiparesis following cerebral infarction affecting right dominant side (HCC)  Muscle weakness (generalized)  Stiffness of right shoulder, not elsewhere classified  Stiffness of right elbow, not elsewhere  classified  Stiffness of right wrist, not elsewhere classified  Stiffness of right hand, not elsewhere classified  Other lack of coordination   PERTINENT HISTORY: L thalamic/BG hemorrhage w/ R hemiparesis in 2016; HTN, HLD, moderate persistent asthma  PRECAUTIONS: Fall  SUBJECTIVE:   SUBJECTIVE STATEMENT: 2 grabbers, shower chair, giraffe for shaving her legs  PAIN: Are you having pain? No   OBJECTIVE:   UE ROM     Right AROM 01/24/22 Right  AROM 02/21/22 Right PROM  Shoulder flexion 80 84 95  Shoulder abduction 75 75 85  Elbow flexion 100 100   Elbow extension -20 -10 -5  Wrist flexion    30  Wrist extension 30 40 40    (Blank rows = not tested)  VISION ASSESSMENT: Impaired Tracking/Visual pursuits: reports increased double vision w/ all tracking toward R side Saccades: decreased speed of saccadic movements Convergence: WFL Visual Fields: no apparent deficits Diplopia assessment: objects split on top of one another and present all the time/all directions   TODAY'S TREATMENT - 03/13/22: AD   PATIENT EDUCATION: Continued condition-specific education, focusing shoulder AAROM in gravity minimized position. Person educated: Patient Education method: Explanation Education comprehension: needs further education    HOME EXERCISE PROGRAM: MedBridge Code: [email or web address]       GOALS: Goals reviewed with patient? Yes   SHORT TERM GOALS: Target date:  02/22/22  STG  Status:  1 Pt will tolerate HEP/stretching program for RUE Baseline: No HEP  at this time Achieved  2 Pt will demonstrate improved active elbow extension to -10* to increase functional use Baseline: AROM -20 PROM -5 Achieved - AROM -10*  3 Pt will demonstrate improved shoulder flexion to 90* to increase functional use Baseline: 80* On-going - AROM 84* PROM 95*      LONG TERM GOALS: Target date:  03/22/22  LTG  Status:  1 Pt will tolerate advanced HEP/stretching program for RUE Baseline: No HEP at  this time On-going  2 Pt will demonstrate improved active elbow extension to neutral to increase functional use Baseline: -20* On-going  3 Pt will demonstrate improved shoulder flexion to 100* to increase functional use Baseline: 80* On-going  4 Pt will demonstrate improved UE functional use/functional grasp for ADLs as evidenced by increasing box/ blocks score by 3 blocks with RUE Baseline: 5 On-going  5 Pt will be independent with splint wear and care PRN Baseline: TBD On-going      ASSESSMENT:   CLINICAL IMPRESSION: Pt reports utilizing RUE as gross assist to stabilizer with holding cucumber and avocado when cutting or peeling. Pt reports increased engagement in shoulder mobility in bed in sidelying, receptive to education this session in supine as well as with dowel in supine.  Pt continues to be limited due to hypertonicity and spasticity in RUE.  Pt continues to benefit from multimodal cues for technique and to decrease compensatory movements.    PERFORMANCE DEFICITS in functional skills including ADLs, IADLs, coordination, dexterity, sensation, tone, ROM, strength, pain, flexibility, GMC, and UE functional use.    IMPAIRMENTS are limiting patient from ADLs and IADLs.    COMORBIDITIES may have co-morbidities  that affects occupational performance. Patient will benefit from skilled OT to address above impairments and improve overall function.      PLAN: OT FREQUENCY: 2x/week   OT DURATION: 8 weeks   PLANNED INTERVENTIONS: self care/ADL training, therapeutic exercise, therapeutic activity, neuromuscular re-education, manual therapy, passive range of motion, balance training, functional mobility training, splinting, electrical stimulation, ultrasound, moist heat, cryotherapy, patient/family education, and DME and/or AE instructions   RECOMMENDED OTHER SERVICES: NA   CONSULTED AND AGREED WITH PLAN OF CARE: Patient   PLAN FOR NEXT SESSION: Further assess diplopia and trial partial  occlusion prn; continue to address RUE ROM and self-stretching   Rosie Fate, MSOT, OTR/L 03/13/2022, 12:26 PM

## 2022-03-19 ENCOUNTER — Ambulatory Visit: Payer: Medicare Other | Admitting: Occupational Therapy

## 2022-03-19 DIAGNOSIS — I69351 Hemiplegia and hemiparesis following cerebral infarction affecting right dominant side: Secondary | ICD-10-CM | POA: Diagnosis not present

## 2022-03-19 DIAGNOSIS — M25621 Stiffness of right elbow, not elsewhere classified: Secondary | ICD-10-CM

## 2022-03-19 DIAGNOSIS — M25631 Stiffness of right wrist, not elsewhere classified: Secondary | ICD-10-CM

## 2022-03-19 DIAGNOSIS — M25641 Stiffness of right hand, not elsewhere classified: Secondary | ICD-10-CM

## 2022-03-19 DIAGNOSIS — M25611 Stiffness of right shoulder, not elsewhere classified: Secondary | ICD-10-CM

## 2022-03-19 DIAGNOSIS — M6281 Muscle weakness (generalized): Secondary | ICD-10-CM

## 2022-03-21 ENCOUNTER — Ambulatory Visit: Payer: Medicare Other | Admitting: Occupational Therapy

## 2022-03-21 DIAGNOSIS — M25621 Stiffness of right elbow, not elsewhere classified: Secondary | ICD-10-CM

## 2022-03-21 DIAGNOSIS — M25611 Stiffness of right shoulder, not elsewhere classified: Secondary | ICD-10-CM

## 2022-03-21 DIAGNOSIS — I69351 Hemiplegia and hemiparesis following cerebral infarction affecting right dominant side: Secondary | ICD-10-CM | POA: Diagnosis not present

## 2022-03-21 DIAGNOSIS — M25631 Stiffness of right wrist, not elsewhere classified: Secondary | ICD-10-CM

## 2022-03-21 DIAGNOSIS — R278 Other lack of coordination: Secondary | ICD-10-CM

## 2022-03-21 DIAGNOSIS — M6281 Muscle weakness (generalized): Secondary | ICD-10-CM

## 2022-03-21 DIAGNOSIS — M25641 Stiffness of right hand, not elsewhere classified: Secondary | ICD-10-CM

## 2022-03-21 NOTE — Therapy (Signed)
OUTPATIENT OCCUPATIONAL THERAPY TREATMENT NOTE   Patient Name: Katie Nichols MRN: [id number] DOB:15-Nov-1970, 51 y.o., female Today's Date: 03/21/2022  PCP: Jolinda Croak, MD   REFERRING PROVIDER: Jolinda Croak, MD   OCCUPATIONAL THERAPY DISCHARGE SUMMARY  Visits from Start of Care: 16  Current functional level related to goals / functional outcomes: Pt has met 4 of 5 LTGs demonstrating improved elbow extension, shoulder flexion, and ease with achieving wrist mobility and finger extension. Pt is able to utilize RUE as gross assist with tasks, improving coordination with improved score on Box and Blocks assessment.  Pt has demonstrated ability to engage in HEP exercises during sessions and at home as well as now has resources for online and in-person mobility courses such as chair/modified yoga.   Remaining deficits: Continues to demonstrate spasticity limiting shoulder, elbow, wrist, and digit mobility.  Pt with pain in R and L shoulder due to tightness in RUE.   Education / Equipment: Pt provided with HEP and community and online resources for UE exercises/stretches and chair yoga.  Pt has been education on exercises and modifications for diplopia.  Pt has been educated on neurologist and/or PM&R intervention for spasticity.   Patient agrees to discharge. Patient goals were met. Patient is being discharged due to meeting the stated rehab goals..      END OF SESSION:   OT End of Session - 03/21/22 1057     Visit Number 16    Number of Visits 17    Date for OT Re-Evaluation 03/22/22    Authorization Type Medicare A&B    Authorization - Visit Number 15    Progress Note Due on Visit 20    OT Start Time 1018    OT Stop Time 1058    OT Time Calculation (min) 40 min    Activity Tolerance Patient tolerated treatment well    Behavior During Therapy WFL for tasks assessed/performed              Past Medical History:  Diagnosis Date   Asthma    Hyperlipidemia     Hypertension    Stroke [facility])    intracerebral hemorrhage   No past surgical history on file. Patient Active Problem List   Diagnosis Date Noted   Nontraumatic subcortical hemorrhage of left cerebral hemisphere ([place]) 04/16/2016   HLD (hyperlipidemia) 04/16/2016   Right spastic hemiparesis ([place]) 04/16/2016   Left hemiparesis ([place]) 11/13/2015   Renal artery stenosis ([place]) 08/10/2015   Hypertension secondary to other renal disorders 08/10/2015   Right hemiplegia ([place]) 06/27/2015   HTN (hypertension), malignant    Acute respiratory failure with hypoxemia ([place]) 06/20/2015   ICH (intracerebral hemorrhage) ([place]) 06/19/2015   Encounter for intubation    Hypertensive emergency    Ventilator dependent ([place])    Hypocalcemia     ONSET DATE: 01/14/2022 (CVA Sept 2016)   REFERRING DIAG: I69.30 (ICD-10-CM) - Unspecified sequelae of cerebral infarction 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, right hand   THERAPY DIAG:  Hemiplegia and hemiparesis following cerebral infarction affecting right dominant side (HCC)  Muscle weakness (generalized)  Stiffness of right shoulder, not elsewhere classified  Stiffness of right elbow, not elsewhere classified  Stiffness of right wrist, not elsewhere classified  Stiffness of right hand, not elsewhere classified  Other lack of coordination   PERTINENT HISTORY: L thalamic/BG hemorrhage w/ R hemiparesis in 2016; HTN, HLD, moderate persistent asthma  PRECAUTIONS: Fall  SUBJECTIVE:   SUBJECTIVE STATEMENT: Pt states "I found out that I can  get my R leg across my L knee (in figure 4) but can not stretch it in that position".     PAIN: Are you having pain? No   OBJECTIVE:   UE ROM     Right AROM 01/24/22 Right  AROM 02/21/22 Right PROM 02/21/22 Right AROM 03/21/22 Right PROM 03/21/22  Shoulder flexion 80 84 95 88 110  Shoulder abduction 75 75 85 75 90  Elbow flexion 100 100  105 122  Elbow extension -20 -10 -5 0   Wrist flexion    30 50 55   Wrist extension 30 40 40 30 55    (Blank rows = not tested)  VISION ASSESSMENT: Impaired Tracking/Visual pursuits: reports increased double vision w/ all tracking toward R side Saccades: decreased speed of saccadic movements Convergence: WFL Visual Fields: no apparent deficits Diplopia assessment: objects split on top of one another and present all the time/all directions  COORDINATION:   EVAL: Box and Blocks:  Right 5 blocks - uses thumb and side of index finger to grasp blocks, hikes shoulder to compensate for decreased elbow and wrist movements, Left 40 blocks 03/22/27 - Box and Blocks: 10 blocks on R - continues to use thumb and side of index finger to grasp blocks, however decreased shoulder hike and improved mobility due to increased shoulder and elbow ROM.   TODAY'S TREATMENT - 03/21/22: Reviewed shoulder exercises per youtube channel that pt found and discussed modifications as appropriate.  Reviewed and updated pt HEP to continue to focus on shoulder flexion and elbow flexion/extension, as well as wrist mobility and hand opening.  Pt is able to complete exercises in seated, supine, and sidelying position.  Reviewed measurements (see above). Provided pt with additional resources for in-person as well as online resources for chair yoga and exercise programs appropriate for pt current status. Reiterated education on recommendation for neurologist and PM&R involvement if pt were to pursue further treatment for spasticity.      PATIENT EDUCATION: Continued condition-specific education, related to spasticity and options for management to discuss w/ MD at upcoming appt Person educated: Patient Education method: Explanation, Demonstration, and Handouts Education comprehension: verbalized understanding and returned demonstration    HOME EXERCISE PROGRAM: MedBridge Code: [email or web address]       GOALS: Goals reviewed with patient? Yes   SHORT TERM GOALS: Target date:  02/22/22  STG   Status:  1 Pt will tolerate HEP/stretching program for RUE Baseline: No HEP at this time Met  2 Pt will demonstrate improved active elbow extension to -10* to increase functional use Baseline: AROM -20 PROM -5 Met - AROM -10*  3 Pt will demonstrate improved shoulder flexion to 90* to increase functional use Baseline: 80* Progressing - AROM 84* PROM 95*      LONG TERM GOALS: Target date:  03/22/22  LTG  Status:  1 Pt will tolerate advanced HEP/stretching program for RUE Baseline: No HEP at this time Met - 03/13/22  2 Pt will demonstrate improved active elbow extension to neutral to increase functional use Baseline: -20* Met - 0* on 03/21/22  3 Pt will demonstrate improved shoulder flexion to 100* to increase functional use Baseline: 80* Not met - 88* on 03/21/22  4 Pt will demonstrate improved UE functional use/functional grasp for ADLs as evidenced by increasing box/ blocks score by 3 blocks with RUE Baseline: 5 Met - 10 blocks on 03/21/22  5 Pt will be independent with splint wear and care PRN Baseline:  Met - pt wears hand  brace at rest occasionally, pt verbalizes understanding of hand positioning recommendations for spasticity management      ASSESSMENT:   CLINICAL IMPRESSION: Reviewed all HEP as well as online resources to continue to engage in post d/c to maintain improved ROM and activity tolerance.  Educated on in-person resources for modified yoga classes and reiterated recommendation for consult with neurologist and/or PM&R for further treatment of spasticity if pt desires to pursue further intervention.  Pt pleased with progress, reporting decreased pain in RUE and ability to achieve finger extension and elbow extension without as much difficulty.     PERFORMANCE DEFICITS in functional skills including ADLs, IADLs, coordination, dexterity, sensation, tone, ROM, strength, pain, flexibility, GMC, and UE functional use.    IMPAIRMENTS are limiting patient from ADLs and IADLs.     COMORBIDITIES may have co-morbidities  that affects occupational performance. Patient will benefit from skilled OT to address above impairments and improve overall function.      PLAN: OT FREQUENCY: 2x/week   OT DURATION: 8 weeks   PLANNED INTERVENTIONS: self care/ADL training, therapeutic exercise, therapeutic activity, neuromuscular re-education, manual therapy, passive range of motion, balance training, functional mobility training, splinting, electrical stimulation, ultrasound, moist heat, cryotherapy, patient/family education, and DME and/or AE instructions   RECOMMENDED OTHER SERVICES: NA   CONSULTED AND AGREED WITH PLAN OF CARE: Patient   PLAN FOR NEXT SESSION: D/c from OT at this time.  Pt would benefit from OT services again in the future if she were to pursue medical intervention for spasticity.   Simonne Come, OTR/L 03/21/2022, 10:57 AM
